# Patient Record
Sex: Male | Born: 1945 | Race: White | Hispanic: No | Marital: Married | Smoking: Former smoker
Health system: Southern US, Community
[De-identification: ages and names within clinical notes are randomized; demographics above are authoritative.]

## PROBLEM LIST (undated history)

## (undated) DIAGNOSIS — F419 Anxiety disorder, unspecified: Secondary | ICD-10-CM

## (undated) DIAGNOSIS — E669 Obesity, unspecified: Secondary | ICD-10-CM

## (undated) DIAGNOSIS — G4730 Sleep apnea, unspecified: Secondary | ICD-10-CM

## (undated) DIAGNOSIS — I10 Essential (primary) hypertension: Secondary | ICD-10-CM

## (undated) DIAGNOSIS — Z9889 Other specified postprocedural states: Secondary | ICD-10-CM

## (undated) DIAGNOSIS — E119 Type 2 diabetes mellitus without complications: Secondary | ICD-10-CM

## (undated) DIAGNOSIS — A809 Acute poliomyelitis, unspecified: Secondary | ICD-10-CM

## (undated) DIAGNOSIS — M1990 Unspecified osteoarthritis, unspecified site: Secondary | ICD-10-CM

## (undated) DIAGNOSIS — T4145XA Adverse effect of unspecified anesthetic, initial encounter: Secondary | ICD-10-CM

## (undated) DIAGNOSIS — I499 Cardiac arrhythmia, unspecified: Secondary | ICD-10-CM

## (undated) DIAGNOSIS — R112 Nausea with vomiting, unspecified: Secondary | ICD-10-CM

## (undated) DIAGNOSIS — T8859XA Other complications of anesthesia, initial encounter: Secondary | ICD-10-CM

## (undated) DIAGNOSIS — N200 Calculus of kidney: Secondary | ICD-10-CM

## (undated) HISTORY — PX: VASECTOMY: SHX75

## (undated) HISTORY — DX: Essential (primary) hypertension: I10

## (undated) HISTORY — DX: Anxiety disorder, unspecified: F41.9

## (undated) HISTORY — DX: Unspecified osteoarthritis, unspecified site: M19.90

## (undated) HISTORY — DX: Type 2 diabetes mellitus without complications: E11.9

---

## 2002-03-30 HISTORY — PX: LUNG SURGERY: SHX703

## 2003-01-01 ENCOUNTER — Emergency Department (HOSPITAL_COMMUNITY): Admission: EM | Admit: 2003-01-01 | Discharge: 2003-01-01 | Payer: Self-pay | Admitting: *Deleted

## 2003-01-01 ENCOUNTER — Encounter: Payer: Self-pay | Admitting: *Deleted

## 2003-01-09 ENCOUNTER — Inpatient Hospital Stay (HOSPITAL_COMMUNITY): Admission: AD | Admit: 2003-01-09 | Discharge: 2003-01-22 | Payer: Self-pay | Admitting: Family Medicine

## 2003-01-09 ENCOUNTER — Encounter: Payer: Self-pay | Admitting: Family Medicine

## 2003-01-09 ENCOUNTER — Ambulatory Visit (HOSPITAL_COMMUNITY): Admission: RE | Admit: 2003-01-09 | Discharge: 2003-01-09 | Payer: Self-pay | Admitting: Family Medicine

## 2003-01-12 ENCOUNTER — Encounter: Payer: Self-pay | Admitting: Family Medicine

## 2003-01-13 ENCOUNTER — Encounter: Payer: Self-pay | Admitting: Thoracic Surgery

## 2003-01-14 ENCOUNTER — Encounter: Payer: Self-pay | Admitting: Thoracic Surgery

## 2003-01-15 ENCOUNTER — Encounter: Payer: Self-pay | Admitting: Thoracic Surgery

## 2003-01-16 ENCOUNTER — Encounter: Payer: Self-pay | Admitting: Thoracic Surgery

## 2003-01-17 ENCOUNTER — Encounter: Payer: Self-pay | Admitting: Thoracic Surgery

## 2003-01-18 ENCOUNTER — Encounter: Payer: Self-pay | Admitting: Thoracic Surgery

## 2003-01-19 ENCOUNTER — Encounter: Payer: Self-pay | Admitting: Thoracic Surgery

## 2003-01-20 ENCOUNTER — Encounter: Payer: Self-pay | Admitting: Thoracic Surgery

## 2003-01-21 ENCOUNTER — Encounter: Payer: Self-pay | Admitting: Thoracic Surgery

## 2003-01-22 ENCOUNTER — Encounter: Payer: Self-pay | Admitting: Thoracic Surgery

## 2003-01-30 ENCOUNTER — Encounter: Admission: RE | Admit: 2003-01-30 | Discharge: 2003-01-30 | Payer: Self-pay | Admitting: Thoracic Surgery

## 2003-02-20 ENCOUNTER — Encounter: Admission: RE | Admit: 2003-02-20 | Discharge: 2003-02-20 | Payer: Self-pay | Admitting: Thoracic Surgery

## 2003-03-13 ENCOUNTER — Encounter: Admission: RE | Admit: 2003-03-13 | Discharge: 2003-03-13 | Payer: Self-pay | Admitting: Thoracic Surgery

## 2003-06-12 ENCOUNTER — Encounter: Admission: RE | Admit: 2003-06-12 | Discharge: 2003-06-12 | Payer: Self-pay | Admitting: Thoracic Surgery

## 2006-05-18 ENCOUNTER — Ambulatory Visit (HOSPITAL_COMMUNITY): Admission: RE | Admit: 2006-05-18 | Discharge: 2006-05-18 | Payer: Self-pay | Admitting: Internal Medicine

## 2006-05-27 ENCOUNTER — Ambulatory Visit: Payer: Self-pay | Admitting: Orthopedic Surgery

## 2008-06-18 ENCOUNTER — Ambulatory Visit (HOSPITAL_COMMUNITY): Admission: RE | Admit: 2008-06-18 | Discharge: 2008-06-18 | Payer: Self-pay | Admitting: Family Medicine

## 2010-04-19 ENCOUNTER — Encounter: Payer: Self-pay | Admitting: Thoracic Surgery

## 2010-07-10 LAB — CREATININE, SERUM
Creatinine, Ser: 1.02 mg/dL (ref 0.4–1.5)
GFR calc Af Amer: >60 mL/min (ref >60)
GFR calc non Af Amer: >60 mL/min (ref >60)

## 2012-06-14 ENCOUNTER — Ambulatory Visit (INDEPENDENT_AMBULATORY_CARE_PROVIDER_SITE_OTHER): Payer: Medicare Other

## 2012-06-14 ENCOUNTER — Encounter: Payer: Self-pay | Admitting: Orthopedic Surgery

## 2012-06-14 ENCOUNTER — Ambulatory Visit (INDEPENDENT_AMBULATORY_CARE_PROVIDER_SITE_OTHER): Payer: Medicare Other | Admitting: Orthopedic Surgery

## 2012-06-14 ENCOUNTER — Ambulatory Visit: Payer: Medicare Other

## 2012-06-14 DIAGNOSIS — M25579 Pain in unspecified ankle and joints of unspecified foot: Secondary | ICD-10-CM

## 2012-06-14 DIAGNOSIS — M25572 Pain in left ankle and joints of left foot: Secondary | ICD-10-CM

## 2012-06-14 DIAGNOSIS — M19079 Primary osteoarthritis, unspecified ankle and foot: Secondary | ICD-10-CM

## 2012-06-14 MED ORDER — DICLOFENAC SODIUM 1 % TD GEL: 2.0000 g | Freq: Four times a day (QID) | TRANSDERMAL | Status: DC

## 2012-06-14 NOTE — Progress Notes (Signed)
Patient ID: [name], male   DOB: 06-Sep-1945, 67 y.o.   MRN: [id number] Chief Complaint  Patient presents with  . Foot Pain    rolled left ankle     Patient history 67 year-old male rolled his left foot approximately 06/02/2012. He has a history of post polio residual cavus foot. He's had pain in his ankle and foot for years his pain increased after his injury. He complains of sharp 8/10 intermittent pain which is worse with walking and associated with swelling most the pain is around the ankle and dorsum of the foot  Review of systems she reports some fatigue he has some sleep apnea and uses a CPAP machine, complains of anxiety easy bruising and seasonal allergies as well as muscle pain. Other than his COPD he's had some lung surgery he has diabetes  He takes naproxen alprazolam metformin doxycycline and lisinopril He has a history of lung disease cancer and diabetes  He is married he operates a Scientist, research (physical sciences) he does not smoke or drink and he completed his education through the 12th grade  General appearance is normal, the patient is alert and oriented x3 with normal mood and affect. 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

## 2012-06-14 NOTE — Patient Instructions (Signed)
Start voltaren gel apply as directed

## 2012-06-21 ENCOUNTER — Telehealth: Payer: Self-pay | Admitting: Orthopedic Surgery

## 2012-06-21 NOTE — Telephone Encounter (Signed)
Patient called, wants to verify dosage of Voltaren gel, which he received from his mail-in pharmacy as prescribed, at "apply 2 grams, 4X daily topically" - although he states the manufacturers' instructions indicate to apply 4 grams 4x daily (twice the amount.)  He will start with Dr. Mort Sawyers instructions as prescribe -- please call him at [phone].

## 2012-06-21 NOTE — Telephone Encounter (Signed)
Returned call to patient and instructed him to use per Dr. Mort Sawyers instructions, which is 2 gm 4 times a day.

## 2012-12-20 ENCOUNTER — Other Ambulatory Visit: Payer: Self-pay | Admitting: *Deleted

## 2012-12-20 ENCOUNTER — Telehealth: Payer: Self-pay | Admitting: Orthopedic Surgery

## 2012-12-20 DIAGNOSIS — M1990 Unspecified osteoarthritis, unspecified site: Secondary | ICD-10-CM

## 2012-12-20 MED ORDER — NABUMETONE 500 MG PO TABS: 500.0000 mg | ORAL_TABLET | Freq: Two times a day (BID) | ORAL | Status: DC

## 2012-12-20 NOTE — Telephone Encounter (Signed)
Sent in prescription electronically. Patient is aware.

## 2012-12-20 NOTE — Telephone Encounter (Signed)
[name] says the Voltaren Gel is not doing much for his pain.  He asked if you will prescribe  Another  Medicine for his arthritis, one that has the least side effects. He gets his medicine by mail, a 90 day supply from : [facility]                                                                                       [address]                                                                                       [place], [place]  [address]                                                                              Phone # [phone]

## 2012-12-20 NOTE — Telephone Encounter (Signed)
relafen 500 bid

## 2013-03-27 ENCOUNTER — Other Ambulatory Visit: Payer: Self-pay | Admitting: *Deleted

## 2013-03-27 DIAGNOSIS — M1990 Unspecified osteoarthritis, unspecified site: Secondary | ICD-10-CM

## 2013-03-27 MED ORDER — NABUMETONE 500 MG PO TABS: 500.0000 mg | ORAL_TABLET | Freq: Two times a day (BID) | ORAL | Status: DC

## 2013-04-24 ENCOUNTER — Telehealth: Payer: Self-pay | Admitting: Orthopedic Surgery

## 2013-04-24 NOTE — Telephone Encounter (Signed)
Patient called to ask question about a possible fax we may have received or will receive regarding his refill of Relafin (generic) 500mg ; states his insurance has no subsitute for it, in the "Tier I" plan, if I am understanding correctly.  Please call patient at cell ph# [phone].

## 2013-04-25 NOTE — Telephone Encounter (Signed)
Spoke with patient and advised when they fax the paperwork we will fill it out and send it in.

## 2013-04-25 NOTE — Telephone Encounter (Signed)
Left message for patient to return my call.

## 2013-11-27 ENCOUNTER — Encounter (INDEPENDENT_AMBULATORY_CARE_PROVIDER_SITE_OTHER): Payer: Self-pay | Admitting: *Deleted

## 2013-11-28 ENCOUNTER — Encounter (INDEPENDENT_AMBULATORY_CARE_PROVIDER_SITE_OTHER): Payer: Self-pay

## 2013-12-05 ENCOUNTER — Other Ambulatory Visit (INDEPENDENT_AMBULATORY_CARE_PROVIDER_SITE_OTHER): Payer: Self-pay | Admitting: *Deleted

## 2013-12-05 DIAGNOSIS — Z86010 Personal history of colonic polyps: Secondary | ICD-10-CM

## 2013-12-29 ENCOUNTER — Telehealth (INDEPENDENT_AMBULATORY_CARE_PROVIDER_SITE_OTHER): Payer: Self-pay | Admitting: *Deleted

## 2013-12-29 DIAGNOSIS — Z1211 Encounter for screening for malignant neoplasm of colon: Secondary | ICD-10-CM

## 2013-12-29 NOTE — Telephone Encounter (Signed)
Patient needs movi prep 

## 2014-01-01 MED ORDER — PEG-KCL-NACL-NASULF-NA ASC-C 100 G PO SOLR: 1.0000 | Freq: Once | ORAL | Status: DC

## 2014-01-04 ENCOUNTER — Telehealth (INDEPENDENT_AMBULATORY_CARE_PROVIDER_SITE_OTHER): Payer: Self-pay | Admitting: *Deleted

## 2014-01-04 NOTE — Telephone Encounter (Signed)
Referring MD/PCP: patient states no PCP   Procedure: tcs  Reason/Indication:  Hx polyps  Has patient had this procedure before?  Yes, 2008 -- scanned  If so, when, by whom and where?    Is there a family history of colon cancer?  no  Who?  What age when diagnosed?    Is patient diabetic?   yes      Does patient have prosthetic heart valve?  no  Do you have a pacemaker?  no  Has patient ever had endocarditis? no  Has patient had joint replacement within last 12 months?  no  Does patient tend to be constipated or take laxatives? no  Is patient on Coumadin, Plavix and/or Aspirin? no  Medications: metformin 500 mg bid, lisinopril 20 mg daily, doxycycline 100 mg daily, alprazolam 1 mg bid  Allergies: sulfur drugs  Medication Adjustment: hold metformin evening before and morning of  Procedure date & time: 02/01/14 at 1200

## 2014-01-04 NOTE — Telephone Encounter (Signed)
agree

## 2014-01-18 ENCOUNTER — Encounter (HOSPITAL_COMMUNITY): Payer: Medicare Other | Admitting: Pharmacy Technician

## 2014-02-01 ENCOUNTER — Encounter (HOSPITAL_COMMUNITY)
Admission: RE | Disposition: A | Discharge status: Home / Self Care | Payer: Medicare Other | Source: Ambulatory Visit | Attending: Internal Medicine

## 2014-02-01 ENCOUNTER — Other Ambulatory Visit: Payer: Self-pay

## 2014-02-01 ENCOUNTER — Observation Stay (HOSPITAL_COMMUNITY)
Admission: RE | Admit: 2014-02-01 | Discharge: 2014-02-03 | Disposition: A | Discharge status: Home / Self Care | Payer: Medicare Other | Source: Ambulatory Visit | Attending: Internal Medicine | Admitting: Internal Medicine

## 2014-02-01 ENCOUNTER — Encounter (HOSPITAL_COMMUNITY): Payer: Self-pay | Admitting: *Deleted

## 2014-02-01 DIAGNOSIS — E119 Type 2 diabetes mellitus without complications: Secondary | ICD-10-CM | POA: Diagnosis not present

## 2014-02-01 DIAGNOSIS — K635 Polyp of colon: Principal | ICD-10-CM | POA: Insufficient documentation

## 2014-02-01 DIAGNOSIS — G4730 Sleep apnea, unspecified: Secondary | ICD-10-CM | POA: Diagnosis not present

## 2014-02-01 DIAGNOSIS — K644 Residual hemorrhoidal skin tags: Secondary | ICD-10-CM | POA: Diagnosis not present

## 2014-02-01 DIAGNOSIS — I5022 Chronic systolic (congestive) heart failure: Secondary | ICD-10-CM | POA: Diagnosis not present

## 2014-02-01 DIAGNOSIS — Z86010 Personal history of colonic polyps: Secondary | ICD-10-CM | POA: Insufficient documentation

## 2014-02-01 DIAGNOSIS — Z6839 Body mass index (BMI) 39.0-39.9, adult: Secondary | ICD-10-CM | POA: Insufficient documentation

## 2014-02-01 DIAGNOSIS — E669 Obesity, unspecified: Secondary | ICD-10-CM | POA: Diagnosis not present

## 2014-02-01 DIAGNOSIS — K219 Gastro-esophageal reflux disease without esophagitis: Secondary | ICD-10-CM | POA: Diagnosis not present

## 2014-02-01 DIAGNOSIS — K649 Unspecified hemorrhoids: Secondary | ICD-10-CM

## 2014-02-01 DIAGNOSIS — D123 Benign neoplasm of transverse colon: Secondary | ICD-10-CM

## 2014-02-01 DIAGNOSIS — I4891 Unspecified atrial fibrillation: Secondary | ICD-10-CM | POA: Diagnosis present

## 2014-02-01 DIAGNOSIS — K6389 Other specified diseases of intestine: Secondary | ICD-10-CM

## 2014-02-01 DIAGNOSIS — Z79899 Other long term (current) drug therapy: Secondary | ICD-10-CM | POA: Insufficient documentation

## 2014-02-01 DIAGNOSIS — I10 Essential (primary) hypertension: Secondary | ICD-10-CM | POA: Diagnosis not present

## 2014-02-01 DIAGNOSIS — F419 Anxiety disorder, unspecified: Secondary | ICD-10-CM | POA: Diagnosis present

## 2014-02-01 HISTORY — DX: Acute poliomyelitis, unspecified: A80.9

## 2014-02-01 HISTORY — DX: Obesity, unspecified: E66.9

## 2014-02-01 HISTORY — DX: Sleep apnea, unspecified: G47.30

## 2014-02-01 HISTORY — PX: COLONOSCOPY: SHX5424

## 2014-02-01 LAB — COMPREHENSIVE METABOLIC PANEL
ALT: 26 U/L (ref 0–53)
AST: 28 U/L (ref 0–37)
Albumin: 3.9 g/dL (ref 3.5–5.2)
Alkaline Phosphatase: 32 U/L — ABNORMAL LOW (ref 39–117)
Anion gap: 14 (ref 5–15)
BUN: 17 mg/dL (ref 6–23)
CALCIUM: 9.2 mg/dL (ref 8.4–10.5)
CO2: 23 mEq/L (ref 19–32)
Chloride: 103 mEq/L (ref 96–112)
Creatinine, Ser: 1.16 mg/dL (ref 0.50–1.35)
GFR, EST AFRICAN AMERICAN: 72 mL/min — AB (ref >90)
GFR, EST NON AFRICAN AMERICAN: 62 mL/min — AB (ref >90)
GLUCOSE: 119 mg/dL — AB (ref 70–99)
Potassium: 5.2 mEq/L (ref 3.7–5.3)
Sodium: 140 mEq/L (ref 137–147)
TOTAL PROTEIN: 7.3 g/dL (ref 6.0–8.3)
Total Bilirubin: 1.1 mg/dL (ref 0.3–1.2)

## 2014-02-01 LAB — GLUCOSE, CAPILLARY
GLUCOSE-CAPILLARY: 134 mg/dL — AB (ref 70–99)
Glucose-Capillary: 111 mg/dL — ABNORMAL HIGH (ref 70–99)
Glucose-Capillary: 129 mg/dL — ABNORMAL HIGH (ref 70–99)

## 2014-02-01 LAB — CBC
HCT: 47.6 % (ref 39.0–52.0)
Hemoglobin: 15.9 g/dL (ref 13.0–17.0)
MCH: 30.3 pg (ref 26.0–34.0)
MCHC: 33.4 g/dL (ref 30.0–36.0)
MCV: 90.7 fL (ref 78.0–100.0)
PLATELETS: 219 10*3/uL (ref 150–400)
RBC: 5.25 MIL/uL (ref 4.22–5.81)
RDW: 13.1 % (ref 11.5–15.5)
WBC: 9.1 10*3/uL (ref 4.0–10.5)

## 2014-02-01 LAB — TROPONIN I: Troponin I: <0.3 ng/mL (ref <0.30)

## 2014-02-01 SURGERY — COLONOSCOPY
Anesthesia: Moderate Sedation

## 2014-02-01 MED ORDER — SODIUM CHLORIDE 0.9 % IV SOLN
250.0000 mL | INTRAVENOUS | Status: DC | PRN
Start: 2014-02-01 — End: 2014-02-02

## 2014-02-01 MED ORDER — MIDAZOLAM HCL 5 MG/5ML IJ SOLN
INTRAMUSCULAR | Status: AC
  Filled 2014-02-01: qty 10

## 2014-02-01 MED ORDER — MIDAZOLAM HCL 5 MG/5ML IJ SOLN
INTRAMUSCULAR | Status: DC | PRN
  Administered 2014-02-01: 3 mg via INTRAVENOUS
  Administered 2014-02-01: 2 mg via INTRAVENOUS
  Administered 2014-02-01: 3 mg via INTRAVENOUS
  Administered 2014-02-01: 2 mg via INTRAVENOUS

## 2014-02-01 MED ORDER — STERILE WATER FOR IRRIGATION IR SOLN
Status: DC | PRN
  Administered 2014-02-01: 12:00:00

## 2014-02-01 MED ORDER — METOPROLOL TARTRATE 25 MG PO TABS: 25.0000 mg | ORAL_TABLET | Freq: Two times a day (BID) | ORAL | Status: DC

## 2014-02-01 MED ORDER — HYDROCODONE-ACETAMINOPHEN 5-325 MG PO TABS: 1.0000 | ORAL_TABLET | ORAL | Status: DC | PRN

## 2014-02-01 MED ORDER — METOPROLOL TARTRATE 25 MG PO TABS
25.0000 mg | ORAL_TABLET | Freq: Four times a day (QID) | ORAL | Status: DC
  Administered 2014-02-01 – 2014-02-03 (×8): 25 mg via ORAL
  Filled 2014-02-01 (×8): qty 1

## 2014-02-01 MED ORDER — SODIUM CHLORIDE 0.9 % IJ SOLN
3.0000 mL | Freq: Two times a day (BID) | INTRAMUSCULAR | Status: DC
  Administered 2014-02-01 – 2014-02-03 (×2): 3 mL via INTRAVENOUS

## 2014-02-01 MED ORDER — ALPRAZOLAM 1 MG PO TABS
1.0000 mg | ORAL_TABLET | Freq: Once | ORAL | Status: AC | PRN
  Administered 2014-02-01: 1 mg via ORAL
  Filled 2014-02-01: qty 1

## 2014-02-01 MED ORDER — ASPIRIN 325 MG PO TABS
325.0000 mg | ORAL_TABLET | Freq: Every day | ORAL | Status: DC
  Administered 2014-02-01 – 2014-02-03 (×3): 325 mg via ORAL
  Filled 2014-02-01 (×3): qty 1

## 2014-02-01 MED ORDER — SODIUM CHLORIDE 0.9 % IJ SOLN: 3.0000 mL | INTRAMUSCULAR | Status: DC | PRN

## 2014-02-01 MED ORDER — LISINOPRIL 10 MG PO TABS
20.0000 mg | ORAL_TABLET | Freq: Every day | ORAL | Status: DC
  Administered 2014-02-01 – 2014-02-03 (×3): 20 mg via ORAL
  Filled 2014-02-01 (×3): qty 2

## 2014-02-01 MED ORDER — METFORMIN HCL 500 MG PO TABS
500.0000 mg | ORAL_TABLET | Freq: Two times a day (BID) | ORAL | Status: DC
  Administered 2014-02-01 – 2014-02-03 (×4): 500 mg via ORAL
  Filled 2014-02-01 (×4): qty 1

## 2014-02-01 MED ORDER — ALUM & MAG HYDROXIDE-SIMETH 200-200-20 MG/5ML PO SUSP: 30.0000 mL | Freq: Four times a day (QID) | ORAL | Status: DC | PRN

## 2014-02-01 MED ORDER — SODIUM CHLORIDE 0.9 % IV SOLN
INTRAVENOUS | Status: DC
  Administered 2014-02-01: 1000 mL via INTRAVENOUS

## 2014-02-01 MED ORDER — DIPHENHYDRAMINE HCL 25 MG PO CAPS: 25.0000 mg | ORAL_CAPSULE | Freq: Every day | ORAL | Status: DC | PRN

## 2014-02-01 MED ORDER — ONDANSETRON HCL 4 MG/2ML IJ SOLN
4.0000 mg | Freq: Four times a day (QID) | INTRAMUSCULAR | Status: DC | PRN
Start: 2014-02-01 — End: 2014-02-03

## 2014-02-01 MED ORDER — MEPERIDINE HCL 50 MG/ML IJ SOLN
INTRAMUSCULAR | Status: DC | PRN
  Administered 2014-02-01 (×2): 25 mg

## 2014-02-01 MED ORDER — MEPERIDINE HCL 50 MG/ML IJ SOLN
INTRAMUSCULAR | Status: AC
  Filled 2014-02-01: qty 1

## 2014-02-01 MED ORDER — DILTIAZEM HCL 25 MG/5ML IV SOLN
10.0000 mg | Freq: Once | INTRAVENOUS | Status: AC
  Administered 2014-02-01: 10 mg via INTRAVENOUS
  Filled 2014-02-01: qty 5

## 2014-02-01 MED ORDER — SODIUM CHLORIDE 0.9 % IJ SOLN
3.0000 mL | Freq: Two times a day (BID) | INTRAMUSCULAR | Status: DC
  Administered 2014-02-01: 3 mL via INTRAVENOUS

## 2014-02-01 MED ORDER — INSULIN ASPART 100 UNIT/ML ~~LOC~~ SOLN
0.0000 [IU] | Freq: Three times a day (TID) | SUBCUTANEOUS | Status: DC
  Administered 2014-02-02 – 2014-02-03 (×3): 2 [IU] via SUBCUTANEOUS

## 2014-02-01 MED ORDER — ONDANSETRON HCL 4 MG PO TABS: 4.0000 mg | ORAL_TABLET | Freq: Four times a day (QID) | ORAL | Status: DC | PRN

## 2014-02-01 MED ORDER — ALPRAZOLAM 1 MG PO TABS
1.0000 mg | ORAL_TABLET | Freq: Three times a day (TID) | ORAL | Status: DC | PRN
  Administered 2014-02-01 – 2014-02-03 (×4): 1 mg via ORAL
  Filled 2014-02-01 (×4): qty 1

## 2014-02-01 MED ORDER — ACETAMINOPHEN 650 MG RE SUPP: 650.0000 mg | Freq: Four times a day (QID) | RECTAL | Status: DC | PRN

## 2014-02-01 MED ORDER — ACETAMINOPHEN 325 MG PO TABS
650.0000 mg | ORAL_TABLET | Freq: Four times a day (QID) | ORAL | Status: DC | PRN
Start: 2014-02-01 — End: 2014-02-03

## 2014-02-01 MED ORDER — ENOXAPARIN SODIUM 40 MG/0.4ML ~~LOC~~ SOLN
40.0000 mg | SUBCUTANEOUS | Status: DC
  Administered 2014-02-01 – 2014-02-02 (×2): 40 mg via SUBCUTANEOUS
  Filled 2014-02-01 (×2): qty 0.4

## 2014-02-01 MED ORDER — METOPROLOL TARTRATE 25 MG PO TABS
50.0000 mg | ORAL_TABLET | Freq: Once | ORAL | Status: AC
  Administered 2014-02-01: 50 mg via ORAL
  Filled 2014-02-01: qty 2

## 2014-02-01 MED ORDER — MORPHINE SULFATE 2 MG/ML IJ SOLN: 1.0000 mg | INTRAMUSCULAR | Status: DC | PRN

## 2014-02-01 MED ORDER — INSULIN ASPART 100 UNIT/ML ~~LOC~~ SOLN: 0.0000 [IU] | Freq: Every day | SUBCUTANEOUS | Status: DC

## 2014-02-01 NOTE — Op Note (Addendum)
COLONOSCOPY PROCEDURE REPORT  PATIENT:  [name]  MR#:  [id number] Birthdate:  1945/11/05, 68 y.o., male Endoscopist:  Dr. Rogene Houston, MD Referred By:  Dr. Glo Herring, MD Procedure Date: 02/01/2014  Procedure:   Colonoscopy  Indications: Patient is 68 year old Caucasian male with history of colonic adenomas who is here for surveillance colonoscopy.  Informed Consent:  The procedure and risks were reviewed with the patient and informed consent was obtained.  Medications:  Demerol 50 mg IV Versed 10 mg IV  Description of procedure:  After a digital rectal exam was performed, that colonoscope was advanced from the anus through the rectum and colon to the area of the cecum, ileocecal valve and appendiceal orifice. The cecum was deeply intubated. These structures were well-seen and photographed for the record. From the level of the cecum and ileocecal valve, the scope was slowly and cautiously withdrawn. The mucosal surfaces were carefully surveyed utilizing scope tip to flexion to facilitate fold flattening as needed. The scope was pulled down into the rectum where a thorough exam including retroflexion was performed.  Findings:   Prep excellent except he had take stool coating the cecal mucosa. This area was washed and landmarks well seen. Small polyp ablated via cold biopsy from splenic flexure. Normal rectal mucosa. Small hemorrhoids below the dentate line along with three tiny anal papillae.     Therapeutic/Diagnostic Maneuvers Performed:  See above  Complications:  none  Cecal Withdrawal Time:  14 minutes  Impression:  Examination performed to cecum. Small polyp ablated via cold biopsy from splenic flexure. External hemorrhoids and 3 anal papillae.  Recommendations:  Patient is to be admitted to a telemetry bed for further management of new onset of atrial fibrillation. I will contact patient with biopsy results and further recommendations.  REHMAN,NAJEEB  U  02/01/2014 12:42 PM  CC: Dr. Glo Herring., MD & Dr. Rayne Du ref. provider found

## 2014-02-01 NOTE — Progress Notes (Addendum)
Dr. Laural Golden in to recheck pt. Hr down to 135 BP 144/97. Decision to go ahead with colonscopy. Oral meds have been given as ordered. Pt denies any chest pain or shortness of breath. "Just nervous".

## 2014-02-01 NOTE — H&P (Addendum)
[name] is an 68 y.o. male.   Chief Complaint:  Patient is here for colonoscopy. HPI:  Patient is 68 year old Caucasian male with history of colonic adenomas and is here for surveillance colonoscopy. Last exam was in September 2008 with removal of 1 tubular adenoma and he had couple of adenomas removed on prior colonoscopy.he denies abdominal pain change in bowel habits or rectal bleeding. Family history is negative for CRC. Patient was noted to be in rapid A. Fib that he does not have chest pain or shortness of breath. He did not take alprazolam and other medications this morning. There is no history of atrial fibrillation.patient was given 1 mg of alprazolam and 50 mg of metoprolol by mouth in preop area with decrease in heart rate and also is blood pressure.  Past Medical History  Diagnosis Date  . COPD (chronic obstructive pulmonary disease)   . Diabetes mellitus   . Hypertension   . Anxiety   . Arthritis   . Polio   . Sleep apnea   . GERD (gastroesophageal reflux disease)     Past Surgical History  Procedure Laterality Date  . Lung surgery N/A   . Vasectomy      History reviewed. No pertinent family history. Social History:  reports that he quit smoking about 2 months ago. He does not have any smokeless tobacco history on file. He reports that he drinks alcohol. He reports that he does not use illicit drugs.  Allergies:  Allergies  Allergen Reactions  . Sulfa Antibiotics Rash    Medications Prior to Admission  Medication Sig Dispense Refill  . ALPRAZolam (XANAX) 1 MG tablet Take 1 mg by mouth 3 (three) times daily as needed for anxiety.    . diphenhydrAMINE (BENADRYL) 25 mg capsule Take 25 mg by mouth daily as needed for allergies.    Marland Kitchen doxycycline (VIBRAMYCIN) 100 MG capsule Take 100 mg by mouth daily.    Marland Kitchen lisinopril (PRINIVIL,ZESTRIL) 20 MG tablet Take 20 mg by mouth daily.    . metFORMIN (GLUCOPHAGE) 500 MG tablet Take 500 mg by mouth 2 (two) times daily with  a meal.      Results for orders placed or performed during the hospital encounter of 02/01/14 (from the past 48 hour(s))  Glucose, capillary     Status: Abnormal   Collection Time: 02/01/14 11:05 AM  Result Value Ref Range   Glucose-Capillary 134 (H) 70 - 99 mg/dL   No results found.  ROS  Blood pressure 144/97, pulse 135, temperature 97.4 F (36.3 C), temperature source Oral, resp. rate 20, height 5\' 10"  (1.778 m), weight 277 lb (125.646 kg), SpO2 98 %. Physical Exam  Constitutional: He appears well-developed and well-nourished.  HENT:  Mouth/Throat: Oropharynx is clear and moist.  Eyes: Conjunctivae are normal. No scleral icterus.  Neck: No thyromegaly present.  Cardiovascular: Normal heart sounds.   No murmur heard. Irregular rhythm normal S1 and S2. No murmur or gallop noted.  Respiratory: Effort normal and breath sounds normal.  GI: Soft. He exhibits no distension and no mass. There is no tenderness.  Protuberant abdomen with umbilical hernia  Musculoskeletal: He exhibits no edema.  Lymphadenopathy:    He has no cervical adenopathy.  Neurological: He is alert.  Skin: Skin is warm and dry.     Assessment/Plan History of colonic adenomas. New onset of atrial fibrillation. Surveillance colonoscopy.    Maranatha [place] U 02/01/2014, 11:58 AM

## 2014-02-01 NOTE — Progress Notes (Signed)
Pt transferred to room 319 on dept 300 via wheelchair from post-op after colonoscopy. Report given to Janace Aris, RN.

## 2014-02-01 NOTE — Progress Notes (Signed)
Patient wears CPAP at home, explained to me he would rather wear his unit, wife is going to run home and get his personal cpap.

## 2014-02-01 NOTE — Progress Notes (Addendum)
Dr. Laural Golden in to see pt concerning his tachycardia and hypertension. Stat EKG done. Dr. Laural Golden ordering medication for pt and calling his primary physician- Dr. Gerarda Fraction. Medication given as ordered. Pt denies chest pain or respiratory difficulty.

## 2014-02-01 NOTE — H&P (Signed)
[facility] History and Physical  [name] FYB:[id number] DOB: 05/21/1945 DOA: 02/01/2014  Referring physician: Laural Golden PCP: Glo Herring., MD   Chief Complaint: afib  HPI: [name] is a very pleasant  68 y.o. male with a past medical history that includes diabetes, hypertension, sleep apnea, obesity, anxiety presents to room 319 as found to be in A. Fib and hypertension prior to colonoscopy this afternoon. Prior to the procedure he was given 1 mg of alprazolam and 50 mg of metoprolol by mouth with a decrease in heart rate and blood pressure. Procedure completed patient's heart rate remained in the low 100s with a stabilize blood pressure. Was offered discharged to home but felt more comfortable being admitted for observation overnight. He reports that he has no cardiac history and has never seen a cardiologist. He denies chest pain palpitations shortness of breath. He denies orthopnea or lower extremity edema. He denies any recent illness or sick contacts. He states that in preparation for the colonoscopy he did not take any of his medications the day before the procedure which  Include 3 mg of Xanax a day, lisinopril and metformin. He arrived to room 319 from postop care he is afebrile hemodynamically stable in atrial fib at a rate of 99. He is not hypoxic.  Review of Systems:  10 point review of systems completed and all systems are negative except as indicated in the history of present illness  Past Medical History  Diagnosis Date  . Diabetes mellitus   . Hypertension   . Anxiety   . Arthritis   . Polio   . Sleep apnea   . GERD (gastroesophageal reflux disease)   . Obesity    Past Surgical History  Procedure Laterality Date  . Lung surgery N/A   . Vasectomy     Social History:  reports that he quit smoking about 2 months ago. He does not have any smokeless tobacco history on file. He reports that he drinks alcohol. He reports that he does not use illicit  drugs. Is married lives at home with his wife he does use a cane for ambulation due to history of polio affecting the left ankle. He reports falling at least 2 times a month Allergies  Allergen Reactions  . Sulfa Antibiotics Rash    History reviewed. No pertinent family history. MI medical history is reviewed and non-contributory to the admission of this elderly gentleman  Prior to Admission medications   Medication Sig Start Date End Date Taking? Authorizing Provider  ALPRAZolam Duanne Moron) 1 MG tablet Take 1 mg by mouth 3 (three) times daily as needed for anxiety.   Yes Historical Provider, MD  diphenhydrAMINE (BENADRYL) 25 mg capsule Take 25 mg by mouth daily as needed for allergies.   Yes Historical Provider, MD  doxycycline (VIBRAMYCIN) 100 MG capsule Take 100 mg by mouth daily.   Yes Historical Provider, MD  lisinopril (PRINIVIL,ZESTRIL) 20 MG tablet Take 20 mg by mouth daily.   Yes Historical Provider, MD  metFORMIN (GLUCOPHAGE) 500 MG tablet Take 500 mg by mouth 2 (two) times daily with a meal.   Yes Historical Provider, MD  metoprolol tartrate (LOPRESSOR) 25 MG tablet Take 1 tablet (25 mg total) by mouth 2 (two) times daily. 02/01/14   Rogene Houston, MD   Physical Exam: Filed Vitals:   02/01/14 1350 02/01/14 1355 02/01/14 1400 02/01/14 1419  BP: 124/74 116/92 119/96 127/73  Pulse:    99  Temp:    98.4 F (  36.9 C)  TempSrc:    Oral  Resp: 17 18 18 18   Height:    5\' 10"  (1.778 m)  Weight:    125.646 kg (277 lb)  SpO2: 95% 94% 94% 95%    Wt Readings from Last 3 Encounters:  02/01/14 125.646 kg (277 lb)  06/14/12 124.286 kg (274 lb)    General:  Appears calm and comfortable, obese Eyes: PERRL, normal lids, irises & conjunctiva, no scleral icterus ENT: grossly normal hearing, Lucas membranes of his mouth are pink and moist Neck: no LAD, masses or thyromegaly Cardiovascular: irregularly irregular I hear no murmur no gallop no rub there is no lower extremity  edema Respiratory: CTA bilaterally, no w/r/r. Normal respiratory effort. Sounds somewhat distant Abdomen: obese soft positive bowel sounds throughout nontender to palpation no guarding or rebounding Skin: no rash or induration seen on limited exam Musculoskeletal: grossly normal tone BUE/BLE Psychiatric: grossly normal mood and affect, speech fluent and appropriate Neurologic: grossly non-focal. Speech clear facial symmetry          Labs on Admission:  Basic Metabolic Panel: No results for input(s): NA, K, CL, CO2, GLUCOSE, BUN, CREATININE, CALCIUM, MG, PHOS in the last 168 hours. Liver Function Tests: No results for input(s): AST, ALT, ALKPHOS, BILITOT, PROT, ALBUMIN in the last 168 hours. No results for input(s): LIPASE, AMYLASE in the last 168 hours. No results for input(s): AMMONIA in the last 168 hours. CBC: No results for input(s): WBC, NEUTROABS, HGB, HCT, MCV, PLT in the last 168 hours. Cardiac Enzymes: No results for input(s): CKTOTAL, CKMB, CKMBINDEX, TROPONINI in the last 168 hours.  BNP (last 3 results) No results for input(s): PROBNP in the last 8760 hours. CBG:  Recent Labs Lab 02/01/14 1105  GLUCAP 134*    Radiological Exams on Admission: No results found.  EKG: await EKG results  Assessment/Plan Principal Problem:   A-fib: etiology uncertain but I favor a multifactorial scenario specifically anxiety in the setting of mild benzo withdrawal and missing doses of antihypertensive medications in preparation for colonoscopy. Will admit for observation to telemetry. Will obtain an EKG, cycle troponin, obtain a 2-D echo to evaluate LV function. He was given 1 mg of Xanax and 50 mg of metoprolol prior to colonoscopy with good results. Home medications include 3 mg of Xanax daily on an as-needed basis. I will continue this. On arrival to the floor heart rate 103. I will provide metoprolol 25 mg twice a day with parameters. Provide aspirin on admission. Chads score 2 (age  and diabetes) which qualifies for anticoagulation. However I suspect he'll convert on his own if he doesn't we will discuss which anticoagulant agent he prefers. He will likely need outpatient cardiology follow-up as well should he not convert back to normal sinus rhythm. Active Problems:  Hypertension: related to anxiety and missed antihypertensive doses. He received metoprolol prior to his colonoscopy today. On presentation to the floor blood pressure is 127/73. I will resume his home lisinopril and provide metoprolol as indicated above with parameters    Diabetes mellitus: he is on metformin at home he typically does not check his blood sugar. I will obtain a hemoglobin A1c continue his metformin and provide him with a carb modified diet. I will also request nutritional consult for diabetes/carb education. Will also use sliding scale insulin as needed for optimal control     Anxiety: he appears to be stable at baseline. He professes to be "very nervous man". He reports taken about 3 mg of  Xanax daily. I will order this on a when necessary basis. I suspect part of his issues related to the fact that he missed a day and a half worth of Xanax. Will monitor    Sleep apnea; Likely related to obstruction given his abdominal girth. Will request respiratory consult first EPAP.    GERD (gastroesophageal reflux disease): Appears stable at baseline.    Obesity: BMI 39.8. Will request nutritional consult for education in nutrition with weight loss being the goal  Polio: reports diagnosed with this when he was very young at the age of 2 were braces recovered nicely had no issues until about a year and a half ago 1 left ankle became weak and somewhat deformed. He's been using a cane ever since. He reports falling 2-3 times a month. Will request PT evaluation for function    Code Status: full DVT Prophylaxis: Family Communication: full Disposition Plan: home hopefully tomorrow  Time spent: 55  minutes  [facility] Pager [phone]

## 2014-02-02 ENCOUNTER — Encounter (HOSPITAL_COMMUNITY): Payer: Self-pay | Admitting: Internal Medicine

## 2014-02-02 DIAGNOSIS — I059 Rheumatic mitral valve disease, unspecified: Secondary | ICD-10-CM

## 2014-02-02 DIAGNOSIS — K635 Polyp of colon: Secondary | ICD-10-CM | POA: Diagnosis not present

## 2014-02-02 DIAGNOSIS — K219 Gastro-esophageal reflux disease without esophagitis: Secondary | ICD-10-CM

## 2014-02-02 LAB — BASIC METABOLIC PANEL
Anion gap: 12 (ref 5–15)
BUN: 22 mg/dL (ref 6–23)
CO2: 26 mEq/L (ref 19–32)
Calcium: 9.3 mg/dL (ref 8.4–10.5)
Chloride: 103 mEq/L (ref 96–112)
Creatinine, Ser: 1.31 mg/dL (ref 0.50–1.35)
GFR, EST AFRICAN AMERICAN: 62 mL/min — AB (ref >90)
GFR, EST NON AFRICAN AMERICAN: 54 mL/min — AB (ref >90)
GLUCOSE: 133 mg/dL — AB (ref 70–99)
Potassium: 4.8 mEq/L (ref 3.7–5.3)
SODIUM: 141 meq/L (ref 137–147)

## 2014-02-02 LAB — GLUCOSE, CAPILLARY
GLUCOSE-CAPILLARY: 110 mg/dL — AB (ref 70–99)
Glucose-Capillary: 137 mg/dL — ABNORMAL HIGH (ref 70–99)
Glucose-Capillary: 120 mg/dL — ABNORMAL HIGH (ref 70–99)
Glucose-Capillary: 125 mg/dL — ABNORMAL HIGH (ref 70–99)

## 2014-02-02 LAB — URINALYSIS, ROUTINE W REFLEX MICROSCOPIC
Bilirubin Urine: NEGATIVE
GLUCOSE, UA: NEGATIVE mg/dL
HGB URINE DIPSTICK: NEGATIVE
Ketones, ur: NEGATIVE mg/dL
Leukocytes, UA: NEGATIVE
Nitrite: NEGATIVE
PH: 5.5 (ref 5.0–8.0)
Protein, ur: NEGATIVE mg/dL
Urobilinogen, UA: 0.2 mg/dL (ref 0.0–1.0)

## 2014-02-02 LAB — CBC
HCT: 46.4 % (ref 39.0–52.0)
HEMOGLOBIN: 15.3 g/dL (ref 13.0–17.0)
MCH: 29.9 pg (ref 26.0–34.0)
MCHC: 33 g/dL (ref 30.0–36.0)
MCV: 90.8 fL (ref 78.0–100.0)
Platelets: 187 10*3/uL (ref 150–400)
RBC: 5.11 MIL/uL (ref 4.22–5.81)
RDW: 13.1 % (ref 11.5–15.5)
WBC: 6.3 10*3/uL (ref 4.0–10.5)

## 2014-02-02 LAB — HEMOGLOBIN A1C
Hgb A1c MFr Bld: 7.2 % — ABNORMAL HIGH (ref <5.7)
Mean Plasma Glucose: 160 mg/dL — ABNORMAL HIGH (ref <117)

## 2014-02-02 LAB — TSH: TSH: 1.57 u[IU]/mL (ref 0.350–4.500)

## 2014-02-02 LAB — TROPONIN I: Troponin I: <0.3 ng/mL (ref <0.30)

## 2014-02-02 MED ORDER — LIVING WELL WITH DIABETES BOOK
Freq: Once | Status: AC
  Administered 2014-02-02: 1
  Filled 2014-02-02: qty 1

## 2014-02-02 MED ORDER — ASPIRIN 325 MG PO TABS: 325.0000 mg | ORAL_TABLET | Freq: Every day | ORAL | Status: DC

## 2014-02-02 MED ORDER — DILTIAZEM HCL 60 MG PO TABS
60.0000 mg | ORAL_TABLET | Freq: Three times a day (TID) | ORAL | Status: DC
  Administered 2014-02-02 – 2014-02-03 (×4): 60 mg via ORAL
  Filled 2014-02-02 (×4): qty 1

## 2014-02-02 MED ORDER — DILTIAZEM HCL ER COATED BEADS 180 MG PO CP24: 180.0000 mg | ORAL_CAPSULE | Freq: Every day | ORAL | Status: DC

## 2014-02-02 MED ORDER — METOPROLOL TARTRATE 50 MG PO TABS: 50.0000 mg | ORAL_TABLET | Freq: Two times a day (BID) | ORAL | Status: DC

## 2014-02-02 NOTE — Evaluation (Addendum)
Physical Therapy Evaluation Patient Details Name: [name] MRN: [id number] DOB: [birth year] Today's Date: 02/02/2014   History of Present Illness  Patient is 68 year old Caucasian male with history of colonic adenomas and is here for surveillance colonoscopy. Last exam was in September 2008 with removal of 1 tubular adenoma and he had couple of adenomas removed on prior colonoscopy.he denies abdominal pain change in bowel habits or rectal bleeding.  Clinical Impression  Pt with a h/o post-polio and presents with club feet. Pt presents with elevated HR with gait. Pt is Independent with bed mobility and transfers. Pt's HR ranged from 110-163 with gait. Pt will have assistance at home if needed. Pt is currently requiring supervision with gait due to HR. Nursing staff can monitor his HR on the unit with gait. Pt does not have an acute skilled PT and is d/c from PT services.    Follow Up Recommendations No PT follow up    Equipment Recommendations  None recommended by PT    Recommendations for Other Services       Precautions / Restrictions Restrictions Weight Bearing Restrictions: No      Mobility  Bed Mobility Overal bed mobility: Independent                Transfers Overall transfer level: Independent                  Ambulation/Gait Ambulation/Gait assistance: Supervision Ambulation Distance (Feet): 75 Feet Assistive device: Straight cane Gait Pattern/deviations: Step-through pattern   Gait velocity interpretation: at or above normal speed for age/gender General Gait Details: HR elevates with gait 165, supervision needed to monitor HR  Stairs            Wheelchair Mobility    Modified Rankin (Stroke Patients Only)       Balance Overall balance assessment: No apparent balance deficits (not formally assessed)                                           Pertinent Vitals/Pain Pain Assessment: No/denies pain    Home Living  Family/patient expects to be discharged to:: Private residence Living Arrangements: Spouse/significant other Available Help at Discharge: Family Type of Home: House Home Access: Stairs to enter   Technical brewer of Steps: 2-3 Home Layout: Two level;Able to live on main level with bedroom/bathroom Home Equipment: Cane - single point Additional Comments: Pt has post-polio syndrome and club feet. He has a h/o falls due to his left anklle rolling.    Prior Function Level of Independence: Independent with assistive device(s)               Hand Dominance        Extremity/Trunk Assessment   Upper Extremity Assessment: Defer to OT evaluation           Lower Extremity Assessment: Overall WFL for tasks assessed (bilateral club feet)      Cervical / Trunk Assessment: Normal  Communication   Communication: No difficulties  Cognition                            General Comments      Exercises        Assessment/Plan    PT Assessment Patent does not need any further PT services  PT Diagnosis     PT Problem List  PT Treatment Interventions     PT Goals (Current goals can be found in the Care Plan section)      Frequency     Barriers to discharge        Co-evaluation               End of Session Equipment Utilized During Treatment: Gait belt Activity Tolerance: Treatment limited secondary to medical complications (Comment) (HR) Patient left: in bed;with call bell/phone within reach Nurse Communication: Mobility status         Time: 1130-1148 PT Time Calculation (min): 18 min   Charges:   PT Evaluation $Initial PT Evaluation Tier I: 1 Procedure     PT G Codes:          Lelon Mast 02/02/2014, 11:52 AM  Late addendum by Rehab supervisor to add g codes per therapist clinical judgement:  [id number], [id number], [place].  Added by rehab supervisor as therapist no longer working at this location and unable to addend the  note.

## 2014-02-02 NOTE — Progress Notes (Signed)
Patient refused diabetic videos at this time.  Requested to watch them later this evening after visitors leave.  Will inform oncoming nurse.

## 2014-02-02 NOTE — Progress Notes (Signed)
UR completed 

## 2014-02-02 NOTE — Plan of Care (Signed)
Problem: Phase I Progression Outcomes Goal: Heart rate or rhythm control medication Outcome: Progressing

## 2014-02-02 NOTE — Progress Notes (Signed)
Patient's heart rate increases to 140's while ambulating but returns to baseline at rest.  Notified MD.  Will continue to monitor patient.

## 2014-02-02 NOTE — Progress Notes (Signed)
Attempted to set patient up for education videos.  Was unable to get connected to videos.  Oncoming nurse is going to attempt on her shift.

## 2014-02-02 NOTE — Progress Notes (Signed)
While ambulating in hall with physical therapy, patient's heart rate increased to 172.  Notified MD and received verbal order to give scheduled Cardizem early.  Will continue to monitor patient.

## 2014-02-02 NOTE — Progress Notes (Signed)
  Echocardiogram 2D Echocardiogram has been performed.  [place], [place] 02/02/2014, 9:58 AM

## 2014-02-02 NOTE — Plan of Care (Signed)
Problem: Phase I Progression Outcomes Goal: Ventricular heart rate < 120/min Outcome: Progressing

## 2014-02-02 NOTE — Plan of Care (Signed)
Problem: Phase I Progression Outcomes Goal: Heart rate or rhythm control medication Outcome: Progressing  Comments:  Notified MD that Heart rate increases when ambulating.  MD ordered cardizem for patient.

## 2014-02-02 NOTE — Progress Notes (Signed)
  RD consulted for nutrition education regarding diabetes/weight loss.    Lab Results  Component Value Date   HGBA1C 7.2* 02/01/2014    RD provided "Carbohydrate Counting for People with Diabetes" and Weight Loss Tips" handouts from the Academy of [employer]etics. Discussed different food groups and their effects on blood sugar, emphasizing carbohydrate-containing foods. Provided list of carbohydrates and recommended serving sizes of common foods.   Discussed importance of controlled and consistent carbohydrate intake throughout the day. Provided examples of ways to balance meals/snacks and encouraged intake of high-fiber, whole grain complex carbohydrates. Teach back method used.  Expect good compliance.  Body mass index is 38.97 kg/(m^2). Pt meets criteria for obesity class II based on current BMI.  Current diet order is CHO Modified/Heart Healthy, patient is consuming approximately 75% of meals at this time. Labs and medications reviewed. RD contact information provided.   Pt has been referred to outpatient RD for additional nutrition counseling.   Colman Cater MS,RD,CSG,LDN Office: [phone] Pager: [phone]

## 2014-02-02 NOTE — Progress Notes (Signed)
Consult received for dm on Metformin with HgbA1C of 7.2%. Pt has primary care provider and [employer].  If pt should be checking glucose at home, I have ordered pt education per video and nursing education. Have ordered RD consult for nutrition education and pt ed booklet. Please instruct pt to follow up with PCP as to checking cbg's at home.  Thank you, Rosita Kea, RN, CNS, Diabetes Coordinator [phone])

## 2014-02-02 NOTE — Discharge Summary (Signed)
Physician Discharge Summary  [name] UVO:[id number] DOB: 02-19-1946 DOA: 02/01/2014  PCP: [name]., MD  Admit date: 02/01/2014 Discharge date: 02/02/2014  Time spent: 40 minutes  Recommendations for Outpatient Follow-up:  1. PCP 1 week for evaluation of HR and rhythm. Consider OP cardiology referral. Discuss anti-coagulation in setting of frequent falls. Evaluate BP and diltiazem added to home meds.  2. Dr Olevia Perches office will contact with results biopsy   Discharge Diagnoses:  Principal Problem:   A-fib Active Problems:   Diabetes mellitus   Hypertension   Anxiety   Sleep apnea   GERD (gastroesophageal reflux disease)   Obesity   Atrial fibrillation with RVR   Discharge Condition: stable  Diet recommendation: heart healthy carb modified  Filed Weights   02/01/14 1047 02/01/14 1419 02/02/14 0555  Weight: 125.646 kg (277 lb) 125.646 kg (277 lb) 123.2 kg (271 lb 9.7 oz)    History of present illness:  [name] is a very pleasant 68 y.o. man with a past medical history that includes diabetes, hypertension, sleep apnea, obesity, anxiety presented to room 319 on 02/01/14 as found to be in A. Fib and hypertensive prior to colonoscopy that afternoon. Prior to the procedure he was given 1 mg of alprazolam and 50 mg of metoprolol by mouth with a decrease in heart rate and blood pressure. Procedure completed patient's heart rate remained in the low 100s with a stabilize blood pressure. Was offered discharged to home but felt more comfortable being admitted for observation overnight. He reported that he had no cardiac history and had never seen a cardiologist. He denied chest pain palpitations shortness of breath. He denied orthopnea or lower extremity edema. He denied any recent illness or sick contacts. He stated that in preparation for the colonoscopy he did not take any of his medications the day before the procedure which Included 3 mg of Xanax and, lisinopril  and metformin. He arrived to room 319 from postop care he was afebrile hemodynamically stable in atrial fib at a rate of 99. He was not hypoxic.   Hospital Course:  A-fib: etiology uncertain but I favor a multifactorial scenario specifically anxiety in the setting of mild benzo withdrawal and missing doses of antihypertensive medications in preparation for colonoscopy. Provided with BB and resumed home lisinopril and rate improved at rest. With ambulation rate increased so diltiazem added.  EKG with afib. Troponin negative x3, 2-D echo yields wall thickness wasincreased in a pattern of moderate LVH. Systolic function was mildly to moderately reduced. The estimated ejection fraction was in the range of 40% to 45%. The study was not technicallysufficient to allow evaluation of LV diastolic dysfunction due toatrial fibrillation.   Provided aspirin on admission. Chads score 2 (age and diabetes) which qualifies for anticoagulation. He also has frequent falls due to unsteady gait. Discussed anti-coagulation and he will follow up with PCP and OP cardiology referral to be considered.  Active Problems: Hypertension: controlled at discharge.   Diabetes mellitus: uncontrolled. A1c 7.2.    Anxiety: stable during hospitalization   Sleep apnea stable   GERD (gastroesophageal reflux disease): remained stable   Obesity: BMI 39.8.   Polio: reports diagnosed with this when he was very young at the age of 2 were braces recovered nicely had no issues until about a year and a half ago 1 left ankle became weak and somewhat deformed. He's been using a cane ever since. He reports falling 2-3 times a month. Will request PT evaluation for function  Procedures: Colonoscopy 02/01/14 examination performed to cecum. Small polyp ablated via cold biopsy from splenic flexure.  External hemorrhoids and 3 anal papillae.  Consultations:  none  Discharge Exam: Filed Vitals:   02/02/14 1145  BP:   Pulse:  163  Temp:   Resp:     General: obese appears comfortable Cardiovascular: irregularly irregular Respiratory: normal effort BS clear bilaterally no wheeze  Discharge Instructions You were cared for by a hospitalist during your hospital stay. If you have any questions about your discharge medications or the care you received while you were in the hospital after you are discharged, you can call the unit and asked to speak with the hospitalist on call if the hospitalist that took care of you is not available. Once you are discharged, your primary care physician will handle any further medical issues. Please note that NO REFILLS for any discharge medications will be authorized once you are discharged, as it is imperative that you return to your primary care physician (or establish a relationship with a primary care physician if you do not have one) for your aftercare needs so that they can reassess your need for medications and monitor your lab values.   Current Discharge Medication List    START taking these medications   Details  aspirin 325 MG tablet Take 1 tablet (325 mg total) by mouth daily.    diltiazem (CARDIZEM CD) 180 MG 24 hr capsule Take 1 capsule (180 mg total) by mouth daily. Qty: 30 capsule, Refills: 0    metoprolol (LOPRESSOR) 50 MG tablet Take 1 tablet (50 mg total) by mouth 2 (two) times daily. Qty: 60 tablet, Refills: 0      CONTINUE these medications which have NOT CHANGED   Details  ALPRAZolam (XANAX) 1 MG tablet Take 1 mg by mouth 3 (three) times daily as needed for anxiety.    diphenhydrAMINE (BENADRYL) 25 mg capsule Take 25 mg by mouth daily as needed for allergies.    doxycycline (VIBRAMYCIN) 100 MG capsule Take 100 mg by mouth daily.    lisinopril (PRINIVIL,ZESTRIL) 20 MG tablet Take 20 mg by mouth daily.    metFORMIN (GLUCOPHAGE) 500 MG tablet Take 500 mg by mouth 2 (two) times daily with a meal.       Allergies  Allergen Reactions  . Sulfa Antibiotics  Rash   Follow-up Information    Follow up with [name]., MD. Schedule an appointment as soon as possible for a visit in 1 week.   Specialty:  Internal Medicine   Why:  for evaluation of HR and rythm. discuss OP cardiology referral. determine anti-coag needs   Contact information:   [address] [place] [address] [phone]        The results of significant diagnostics from this hospitalization (including imaging, microbiology, ancillary and laboratory) are listed below for reference.    Significant Diagnostic Studies: No results found.  Microbiology: No results found for this or any previous visit (from the past 240 hour(s)).   Labs: Basic Metabolic Panel:  Recent Labs Lab 02/01/14 1506 02/02/14 0249  NA 140 141  K 5.2 4.8  CL 103 103  CO2 23 26  GLUCOSE 119* 133*  BUN 17 22  CREATININE 1.16 1.31  CALCIUM 9.2 9.3   Liver Function Tests:  Recent Labs Lab 02/01/14 1506  AST 28  ALT 26  ALKPHOS 32*  BILITOT 1.1  PROT 7.3  ALBUMIN 3.9   No results for input(s): LIPASE, AMYLASE in the last  168 hours. No results for input(s): AMMONIA in the last 168 hours. CBC:  Recent Labs Lab 02/01/14 1506 02/02/14 0249  WBC 9.1 6.3  HGB 15.9 15.3  HCT 47.6 46.4  MCV 90.7 90.8  PLT 219 187   Cardiac Enzymes:  Recent Labs Lab 02/01/14 1506 02/01/14 2035 02/02/14 0249  TROPONINI <0.30 <0.30 <0.30   BNP: BNP (last 3 results) No results for input(s): PROBNP in the last 8760 hours. CBG:  Recent Labs Lab 02/01/14 1105 02/01/14 1628 02/01/14 2135 02/02/14 0800 02/02/14 1122  GLUCAP 134* 111* 129* 137* 110*       Signed:  BLACK,KAREN M  [facility] 02/02/2014, 12:04 PM

## 2014-02-03 DIAGNOSIS — K635 Polyp of colon: Secondary | ICD-10-CM | POA: Diagnosis not present

## 2014-02-03 LAB — GLUCOSE, CAPILLARY
GLUCOSE-CAPILLARY: 130 mg/dL — AB (ref 70–99)
Glucose-Capillary: 147 mg/dL — ABNORMAL HIGH (ref 70–99)

## 2014-02-03 MED ORDER — DILTIAZEM HCL ER COATED BEADS 180 MG PO CP24: 180.0000 mg | ORAL_CAPSULE | Freq: Every day | ORAL | Status: DC

## 2014-02-03 MED ORDER — METOPROLOL TARTRATE 50 MG PO TABS: 50.0000 mg | ORAL_TABLET | Freq: Two times a day (BID) | ORAL | Status: DC

## 2014-02-03 MED ORDER — ASPIRIN 325 MG PO TABS: 325.0000 mg | ORAL_TABLET | Freq: Every day | ORAL | Status: DC

## 2014-02-03 NOTE — Discharge Summary (Signed)
Physician Discharge Summary  [name] BPZ:[id number] DOB: [birth year] DOA: 02/01/2014  PCP: [name]., MD  Admit date: 02/01/2014 Discharge date: 02/03/2014  Time spent: 40 minutes  Recommendations for Outpatient Follow-up:  1. Patient will follow up with cardiology on 02/08/14 for further follow-up on atrial fibrillation and need for anticoagulation 2. Follow-up with primary care physician a 1-2 weeks 3. Follow-up with GI as needed for biopsy results obtained during colonoscopy  Discharge Diagnoses:  Principal Problem:   Atrial fibrillation with RVR Active Problems:   Diabetes mellitus   Hypertension   Anxiety   Sleep apnea   GERD (gastroesophageal reflux disease)   A-fib   Obesity chronic systolic congestive heart failure, EF 40-45%  Discharge Condition: improved  Diet recommendation: low-salt, low carb  Filed Weights   02/01/14 1047 02/01/14 1419 02/02/14 0555  Weight: 125.646 kg (277 lb) 125.646 kg (277 lb) 123.2 kg (271 lb 9.7 oz)    History of present illness:  This is a very pleasant 68 year old gentleman who had presented to a hospital for routine screening colonoscopy.prior to procedure, he was noted to be markedly hypertensive and tachycardic. He was found to be a new onset atrial fibrillation with rapid ventricular response. The patient was admitted to the hospital for further treatments.  Hospital Course:  Patient was monitored on telemetry. He was started on oral Lopressor as well as Cardizem. His heart rate has significantly improved and is now controlled. TSH is found to be normal and cardiac enzymes are currently negative. 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 He is now rate controlled. His chads score would be 3, making him a candidate for anticoagulation. I have discussed the options of starting anticoagulation  in the hospital, the patient wishes to follow up with cardiology as an outpatient to discuss this further prior to beginning any therapy. He has agreed to take aspirin at this time. We have arrange cardiology follow-up on 11/12 to discuss anticoagulation and further management of atrial fibrillation. Patient is feeling significantly improved. He was ambulated in the hall and maintained control of his heart rate. He is not short of breath and does not have any chest pain. Patient is ready for discharge home today.  [name],[name]   Procedures:  Echo: - Left ventricle: The cavity size was normal. Wall thickness was increased in a pattern of moderate LVH. Systolic function was mildly to moderately reduced. The estimated ejection fraction was in the range of 40% to 45%. The study was not technically sufficient to allow evaluation of LV diastolic dysfunction due to atrial fibrillation. - Aortic valve: Mildly to moderately calcified annulus. Trileaflet; mildly thickened leaflets. - Aorta: The visualized portion of the proximal ascending thoracic aorta is mildly dialted at 3.7 cm. Aortic root dimension: 32 mm (ED). - Aortic root: The aortic root was normal in size. - Mitral valve: Mildly calcified annulus. Mildly thickened leaflets . There was mild regurgitation. - Left atrium: The atrium was severely dilated. - Right ventricle: The cavity size was mildly to moderately dilated. - Right atrium: The atrium was severely dilated. - Atrial septum: No defect or patent foramen ovale was identified. - Inferior vena cava: The vessel was dilated. The respirophasic diameter changes were blunted (< 50%), consistent with elevated central venous pressure.  Consultations:    Discharge Exam: Filed Vitals:   02/03/14 1056  BP:   Pulse: 63  Temp:  Resp:     General: NAD Cardiovascular: S1, s2 irregular Respiratory: CTA B  Discharge Instructions You were cared for by a  hospitalist during your hospital stay. If you have any questions about your discharge medications or the care you received while you were in the hospital after you are discharged, you can call the unit and asked to speak with the hospitalist on call if the hospitalist that took care of you is not available. Once you are discharged, your primary care physician will handle any further medical issues. Please note that NO REFILLS for any discharge medications will be authorized once you are discharged, as it is imperative that you return to your primary care physician (or establish a relationship with a primary care physician if you do not have one) for your aftercare needs so that they can reassess your need for medications and monitor your lab values.  Discharge Instructions    Call MD for:  difficulty breathing, headache or visual disturbances    Complete by:  As directed      Call MD for:  extreme fatigue    Complete by:  As directed      Call MD for:  persistant dizziness or light-headedness    Complete by:  As directed      Diet - low sodium heart healthy    Complete by:  As directed      Diet Carb Modified    Complete by:  As directed      Increase activity slowly    Complete by:  As directed           Current Discharge Medication List    START taking these medications   Details  aspirin 325 MG tablet Take 1 tablet (325 mg total) by mouth daily. Qty: 30 tablet, Refills: 0    diltiazem (CARDIZEM CD) 180 MG 24 hr capsule Take 1 capsule (180 mg total) by mouth daily. Qty: 30 capsule, Refills: 0    metoprolol (LOPRESSOR) 50 MG tablet Take 1 tablet (50 mg total) by mouth 2 (two) times daily. Qty: 60 tablet, Refills: 0      CONTINUE these medications which have NOT CHANGED   Details  ALPRAZolam (XANAX) 1 MG tablet Take 1 mg by mouth 3 (three) times daily as needed for anxiety.    diphenhydrAMINE (BENADRYL) 25 mg capsule Take 25 mg by mouth daily as needed for allergies.     doxycycline (VIBRAMYCIN) 100 MG capsule Take 100 mg by mouth daily.    lisinopril (PRINIVIL,ZESTRIL) 20 MG tablet Take 20 mg by mouth daily.    metFORMIN (GLUCOPHAGE) 500 MG tablet Take 500 mg by mouth 2 (two) times daily with a meal.       Allergies  Allergen Reactions  . Sulfa Antibiotics Rash   Follow-up Information    Follow up with [name]., MD. Schedule an appointment as soon as possible for a visit in 1 week.   Specialty:  Internal Medicine   Contact information:   [address] [place] [address] [phone]       Follow up with Herminio Commons, MD On 02/08/2014.   Specialty:  Cardiology   Why:  8:40am   Contact information:   [place] [address] [phone]        The results of significant diagnostics from this hospitalization (including imaging, microbiology, ancillary and laboratory) are listed below for reference.    Significant Diagnostic Studies: No results found.  Microbiology: No results  found for this or any previous visit (from the past 240 hour(s)).   Labs: Basic Metabolic Panel:  Recent Labs Lab 02/01/14 1506 02/02/14 0249  NA 140 141  K 5.2 4.8  CL 103 103  CO2 23 26  GLUCOSE 119* 133*  BUN 17 22  CREATININE 1.16 1.31  CALCIUM 9.2 9.3   Liver Function Tests:  Recent Labs Lab 02/01/14 1506  AST 28  ALT 26  ALKPHOS 32*  BILITOT 1.1  PROT 7.3  ALBUMIN 3.9   No results for input(s): LIPASE, AMYLASE in the last 168 hours. No results for input(s): AMMONIA in the last 168 hours. CBC:  Recent Labs Lab 02/01/14 1506 02/02/14 0249  WBC 9.1 6.3  HGB 15.9 15.3  HCT 47.6 46.4  MCV 90.7 90.8  PLT 219 187   Cardiac Enzymes:  Recent Labs Lab 02/01/14 1506 02/01/14 2035 02/02/14 0249  TROPONINI <0.30 <0.30 <0.30   BNP: BNP (last 3 results) No results for input(s): PROBNP in the last 8760 hours. CBG:  Recent Labs Lab 02/02/14 1122 02/02/14 1721 02/02/14 2134 02/03/14 0719  02/03/14 1126  GLUCAP 110* 120* 125* 130* 147*       Signed:  MEMON,[name]  [facility] 02/03/2014, 2:32 PM

## 2014-02-03 NOTE — Progress Notes (Signed)
Videos unattainable this shift.

## 2014-02-03 NOTE — Progress Notes (Signed)
Utilization Review completed.  

## 2014-02-03 NOTE — Discharge Instructions (Signed)

## 2014-02-03 NOTE — Progress Notes (Signed)
Pt alert, oriented, and vital signs stable upon discharge. Reviewed discharge instructions and paperwork. Pt given prescriptions. No iv present upon discharge.

## 2014-02-08 ENCOUNTER — Encounter: Payer: Self-pay | Admitting: Cardiovascular Disease

## 2014-02-08 ENCOUNTER — Ambulatory Visit (INDEPENDENT_AMBULATORY_CARE_PROVIDER_SITE_OTHER): Payer: Medicare Other | Admitting: Cardiovascular Disease

## 2014-02-08 ENCOUNTER — Other Ambulatory Visit: Payer: Self-pay | Admitting: Cardiovascular Disease

## 2014-02-08 VITALS — BP 124/88 | HR 90 | Ht 70.0 in | Wt 271.0 lb

## 2014-02-08 DIAGNOSIS — I4891 Unspecified atrial fibrillation: Secondary | ICD-10-CM

## 2014-02-08 DIAGNOSIS — I429 Cardiomyopathy, unspecified: Secondary | ICD-10-CM

## 2014-02-08 DIAGNOSIS — I10 Essential (primary) hypertension: Secondary | ICD-10-CM

## 2014-02-08 DIAGNOSIS — E1121 Type 2 diabetes mellitus with diabetic nephropathy: Secondary | ICD-10-CM

## 2014-02-08 DIAGNOSIS — G4730 Sleep apnea, unspecified: Secondary | ICD-10-CM

## 2014-02-08 DIAGNOSIS — E1122 Type 2 diabetes mellitus with diabetic chronic kidney disease: Secondary | ICD-10-CM

## 2014-02-08 DIAGNOSIS — N183 Chronic kidney disease, stage 3 (moderate): Secondary | ICD-10-CM

## 2014-02-08 DIAGNOSIS — M1990 Unspecified osteoarthritis, unspecified site: Secondary | ICD-10-CM

## 2014-02-08 MED ORDER — METOPROLOL SUCCINATE ER 50 MG PO TB24: 50.0000 mg | ORAL_TABLET | Freq: Two times a day (BID) | ORAL | Status: DC

## 2014-02-08 MED ORDER — EDOXABAN TOSYLATE 60 MG PO TABS: 60.0000 mg | ORAL_TABLET | Freq: Every day | ORAL | Status: DC

## 2014-02-08 NOTE — Progress Notes (Signed)
Patient ID: [name], male   DOB: September 28, 1945, 68 y.o.   MRN: [id number]       CARDIOLOGY CONSULT NOTE  Patient ID: [name] MRN: [id number] DOB/AGE: [birth year] 68 y.o.  Admit date: (Not on file) Primary Physician Glo Herring., MD  Reason for Consultation: cardiomyopathy, atrial fibrillation  HPI:  The patient is a 68 year old male with a history of hypertension, sleep apnea, and diabetes mellitus who was recently hospitalized for new onset, rapid atrial fibrillation. This developed prior to undergoing a screening colonoscopy. It was incidentally found as the patient had no symptoms whatsoever. TSH and troponins were normal. An echocardiogram was performed which demonstrated mild to moderately reduced left ventricular systolic function, EF 63-01%, mild mitral regurgitation, severe left atrial enlargement, mild to moderate right ventricular enlargement, and severe right atrial enlargement. He was started on metoprolol and diltiazem as well as aspirin 325 mg. While he has felt somewhat sluggish since starting these new medications, he denies any chest pain, palpitations, dizziness, shortness of breath, orthopnea, leg swelling, and paroxysmal nocturnal dyspnea. 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 Ever since then, he has had left ankle swelling and weakness. He was diagnosed with sleep apnea proximally 5 years ago and uses CPAP daily. He is here with this wife.  Soc: Nonsmoker. Owns a [employer].   Allergies  Allergen Reactions  . Sulfa Antibiotics Rash    Current Outpatient Prescriptions  Medication Sig Dispense Refill  . ALPRAZolam (XANAX) 1 MG tablet Take 1 mg by mouth 3 (three) times daily as needed for anxiety.    Marland Kitchen aspirin 325 MG tablet Take 1 tablet (325 mg total) by mouth daily. 30 tablet 0  . diltiazem (CARDIZEM CD) 180 MG 24 hr capsule Take 1 capsule (180 mg total) by mouth daily. 30  capsule 0  . diphenhydrAMINE (BENADRYL) 25 mg capsule Take 25 mg by mouth daily as needed for allergies.    Marland Kitchen doxycycline (VIBRAMYCIN) 100 MG capsule Take 100 mg by mouth daily.    Marland Kitchen lisinopril (PRINIVIL,ZESTRIL) 20 MG tablet Take 20 mg by mouth daily.    . metFORMIN (GLUCOPHAGE) 500 MG tablet Take 500 mg by mouth 2 (two) times daily with a meal.    . metoprolol (LOPRESSOR) 50 MG tablet Take 1 tablet (50 mg total) by mouth 2 (two) times daily. 60 tablet 0   No current facility-administered medications for this visit.    Past Medical History  Diagnosis Date  . Diabetes mellitus   . Hypertension   . Anxiety   . Arthritis   . Polio   . Sleep apnea   . GERD (gastroesophageal reflux disease)   . Obesity     Past Surgical History  Procedure Laterality Date  . Lung surgery N/A   . Vasectomy    . Colonoscopy N/A 02/01/2014    Procedure: COLONOSCOPY;  Surgeon: Rogene Houston, MD;  Location: [facility];  Service: Endoscopy;  Laterality: N/A;  1200    History   Social History  . Marital Status: Married    Spouse Name: N/A    Number of Children: N/A  . Years of Education: N/A   Occupational History  . Not on file.   Social History Main Topics  . Smoking status: Former Smoker -- 0.50 packs/day for 10 years    Types: Cigarettes    Start date: 03/31/1951    Quit date: 11/16/2013  . Smokeless  tobacco: Never Used  . Alcohol Use: 0.0 oz/week    0 Not specified per week     Comment: social  . Drug Use: No  . Sexual Activity: Not on file   Other Topics Concern  . Not on file   Social History Narrative     No family history of premature CAD in 1st degree relatives.  Prior to Admission medications   Medication Sig Start Date End Date Taking? Authorizing Provider  ALPRAZolam Duanne Moron) 1 MG tablet Take 1 mg by mouth 3 (three) times daily as needed for anxiety.    Historical Provider, MD  aspirin 325 MG tablet Take 1 tablet (325 mg total) by mouth daily. 02/03/14   Kathie Dike, MD  diltiazem (CARDIZEM CD) 180 MG 24 hr capsule Take 1 capsule (180 mg total) by mouth daily. 02/03/14   Kathie Dike, MD  diphenhydrAMINE (BENADRYL) 25 mg capsule Take 25 mg by mouth daily as needed for allergies.    Historical Provider, MD  doxycycline (VIBRAMYCIN) 100 MG capsule Take 100 mg by mouth daily.    Historical Provider, MD  lisinopril (PRINIVIL,ZESTRIL) 20 MG tablet Take 20 mg by mouth daily.    Historical Provider, MD  metFORMIN (GLUCOPHAGE) 500 MG tablet Take 500 mg by mouth 2 (two) times daily with a meal.    Historical Provider, MD  metoprolol (LOPRESSOR) 50 MG tablet Take 1 tablet (50 mg total) by mouth 2 (two) times daily. 02/03/14   Kathie Dike, MD     Review of systems complete and found to be negative unless listed above in HPI     Physical exam Blood pressure 124/88, pulse 90, height 5\' 10"  (1.778 m), weight 271 lb (122.925 kg). General: NAD Neck: No JVD, no thyromegaly or thyroid nodule.  Lungs: Clear to auscultation bilaterally with normal respiratory effort. CV: Nondisplaced PMI. Irregular rhythm, normal S1/S2, no S3, no murmur.  Mild left ankle and dorsal foot edema.  No carotid bruit.  Normal pedal pulses.  Abdomen: Soft, nontender, obese, no distention.  Skin: Intact without lesions or rashes.  Neurologic: Alert and oriented x 3.  Psych: Normal affect. Extremities: No clubbing or cyanosis.  HEENT: Normal.   ECG: Most recent ECG reviewed.  Labs:   Lab Results  Component Value Date   WBC 6.3 02/02/2014   HGB 15.3 02/02/2014   HCT 46.4 02/02/2014   MCV 90.8 02/02/2014   PLT 187 02/02/2014    Recent Labs Lab 02/01/14 1506 02/02/14 0249  NA 140 141  K 5.2 4.8  CL 103 103  CO2 23 26  BUN 17 22  CREATININE 1.16 1.31  CALCIUM 9.2 9.3  PROT 7.3  --   BILITOT 1.1  --   ALKPHOS 32*  --   ALT 26  --   AST 28  --   GLUCOSE 119* 133*   Lab Results  Component Value Date   TROPONINI <0.30 02/02/2014   No results found for: CHOL No  results found for: HDL No results found for: LDLCALC No results found for: TRIG No results found for: CHOLHDL No results found for: LDLDIRECT       Studies:  Echo - Left ventricle: The cavity size was normal. Wall thickness was increased in a pattern of moderate LVH. Systolic function was mildly to moderately reduced. The estimated ejection fraction was in the range of 40% to 45%. The study was not technically sufficient to allow evaluation of LV diastolic dysfunction due to atrial fibrillation. - Aortic  valve: Mildly to moderately calcified annulus. Trileaflet; mildly thickened leaflets. - Aorta: The visualized portion of the proximal ascending thoracic aorta is mildly dialted at 3.7 cm. Aortic root dimension: 32 mm (ED). - Aortic root: The aortic root was normal in size. - Mitral valve: Mildly calcified annulus. Mildly thickened leaflets . There was mild regurgitation. - Left atrium: The atrium was severely dilated. - Right ventricle: The cavity size was mildly to moderately dilated. - Right atrium: The atrium was severely dilated. - Atrial septum: No defect or patent foramen ovale was identified. - Inferior vena cava: The vessel was dilated. The respirophasic diameter changes were blunted (< 50%), consistent with elevated central venous pressure. - Technically adequate study.   ASSESSMENT AND PLAN:  1. Cardiomyopathy: He has no history of MI. Given the diffuse hypokinesis, this is likely to be nonischemic in etiology. 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 I will switch metoprolol tartrate to metoprolol succinate 50 mg twice daily. For the time being, I will continue long-acting diltiazem 180 mg daily but would consider reducing this dose in the future. I may consider nuclear stress testing in the future. 2. Atrial fibrillation: HR is controlled. As stated above,  will switch metoprolol tartrate to metoprolol succinate given his cardiomyopathy. Will continue long-acting diltiazem daily. CHADSVASC score 4 (CHF, HTN, age, diabetes), thus anticoagulation is indicated. I will d/c ASA and start edoxaban 60 mg daily. GFR 54 ml/min. 3. Sleep apnea: Uses CPAP daily. Likely etiology of right-sided chamber enlargement. 4. Type 2 diabetes: Mildly uncontrolled with HbA1C 7.2%. Continue metformin 500 mg bid. 5. Essential HTN: Well controlled on current therapy. No changes. 6. CKD stage 3: GFR 54 ml/min. No changes to therapy. 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 7. Arthritis: I recommended he start engaging in aqua therapy for joint pain relief (pool at [facility]).  Dispo: f/u 1 month.   Signed: Kate Sable, M.D., F.A.C.C.  02/08/2014, 8:46 AM

## 2014-02-08 NOTE — Patient Instructions (Addendum)
Your physician recommends that you schedule a follow-up appointment in: 1 month    Your physician has recommended you make the following change in your medication:     STOP Aspirin   START Savaysa 60 mg daily  START Toprol XL 50 mg twice a day  STOP Metoprolol (lopressor)         Thank you for choosing [facility] !

## 2014-02-08 NOTE — Telephone Encounter (Signed)
SAVAYSA is not covered by pt insurance. Will forward to Dr. Bronson Ing Please advise on alternatives.

## 2014-02-08 NOTE — Telephone Encounter (Signed)
Please see refill bin / tgs  °

## 2014-02-09 ENCOUNTER — Telehealth: Payer: Self-pay | Admitting: *Deleted

## 2014-02-09 MED ORDER — RIVAROXABAN 15 MG PO TABS: 15.0000 mg | ORAL_TABLET | Freq: Every day | ORAL | Status: DC

## 2014-02-09 MED ORDER — RIVAROXABAN 20 MG PO TABS: 20.0000 mg | ORAL_TABLET | Freq: Every day | ORAL | Status: DC

## 2014-02-09 NOTE — Telephone Encounter (Signed)
Pt will pick up samples today

## 2014-02-09 NOTE — Addendum Note (Signed)
Addended by: Barbarann Ehlers A on: 02/09/2014 11:29 AM   Modules accepted: Orders, Medications

## 2014-02-09 NOTE — Telephone Encounter (Signed)
Pt was seen this week and increased his blood thinners. He has blood in his urine for the past three times of urinating. He states it is bright red, not yellow or orange.

## 2014-02-09 NOTE — Telephone Encounter (Signed)
Per Dr.Koneswaran,decrease Xarelto to 15 mg daily. E-sribed to [employer] [employer], spoke with [facility], they cannot accept xarelto 20 mg back once it leaves the pharmacy.I have given pt samples

## 2014-02-09 NOTE — Telephone Encounter (Signed)
E-scribed Xarelto 20 mg daily,have samples and 30 day trial card here.Await pt call back

## 2014-02-09 NOTE — Telephone Encounter (Signed)
Started on Savaysa yesterday,took 1 tablet yesterday.States urine is bright red after two voids,denies burning or pain. We stopped the savaysa due to cost,has not started xarelto yet.

## 2014-02-09 NOTE — Telephone Encounter (Signed)
Please communicate this to the rep for Savaysa. Start Xarelto 20 mg daily.

## 2014-02-12 ENCOUNTER — Telehealth: Payer: Self-pay | Admitting: Cardiovascular Disease

## 2014-02-12 DIAGNOSIS — R319 Hematuria, unspecified: Secondary | ICD-10-CM

## 2014-02-12 LAB — CBC
HCT: 48.8 % (ref 39.0–52.0)
HEMOGLOBIN: 16.9 g/dL (ref 13.0–17.0)
MCH: 30.5 pg (ref 26.0–34.0)
MCHC: 34.6 g/dL (ref 30.0–36.0)
MCV: 87.9 fL (ref 78.0–100.0)
MPV: 11 fL (ref 9.4–12.4)
Platelets: 244 10*3/uL (ref 150–400)
RBC: 5.55 MIL/uL (ref 4.22–5.81)
RDW: 13.5 % (ref 11.5–15.5)
WBC: 8.8 10*3/uL (ref 4.0–10.5)

## 2014-02-12 NOTE — Telephone Encounter (Signed)
Please call patient back regarding blood in his urine.[name] / tgs

## 2014-02-12 NOTE — Telephone Encounter (Signed)
Spoke with Dr.Koneswaran ,he wants pt to remain on Xarelto 15 mg and see a urologist for hematuria.He plans to call [facility] where he has been seen before. He will get a CBC today

## 2014-02-13 ENCOUNTER — Telehealth: Payer: Self-pay

## 2014-02-13 ENCOUNTER — Ambulatory Visit (INDEPENDENT_AMBULATORY_CARE_PROVIDER_SITE_OTHER): Payer: Medicare Other | Admitting: Urology

## 2014-02-13 DIAGNOSIS — R310 Gross hematuria: Secondary | ICD-10-CM

## 2014-02-13 NOTE — Telephone Encounter (Signed)
error 

## 2014-02-14 ENCOUNTER — Telehealth (INDEPENDENT_AMBULATORY_CARE_PROVIDER_SITE_OTHER): Payer: Self-pay | Admitting: *Deleted

## 2014-02-14 NOTE — Telephone Encounter (Signed)
Patient called office to update his condition. It is noted in Epic what has been going on with the patient. After discharge from hospital he saw Cardiologist. Patient states that he was given 8-9 different pills to help with heart rate,blood pressure.. The next day he began to urinate blood and he is continuing to do so . Patient states that he is voiding every 15-30 minutes and its pure blood. He was told by the Cardiologist that he may have been given to much medication and this caused a"over kill." He saw the urologist,([facility]) here in [place] yesterday. They told patient that they were going to do CAT Scan. He has not heard anything from them and states that he cannot get a response from anyone. He wanted to know what Dr.Rehman thought.  Dr.Rehman was made aware. He recommended that the patient be seen in the ED as he is on blood thinner and he urinating blood and this has been on going for several days. Patient was called and made aware.

## 2014-02-14 NOTE — Telephone Encounter (Signed)
LM - Elfego is at work and would like to speak with Dr. Laural Golden. If he is not available, please have the nurse to give him a call at [phone].

## 2014-02-16 ENCOUNTER — Other Ambulatory Visit: Payer: Self-pay | Admitting: Urology

## 2014-02-16 DIAGNOSIS — R310 Gross hematuria: Secondary | ICD-10-CM

## 2014-02-19 ENCOUNTER — Ambulatory Visit (HOSPITAL_COMMUNITY)
Admission: RE | Admit: 2014-02-19 | Discharge: 2014-02-19 | Disposition: A | Discharge status: Home / Self Care | Payer: Medicare Other | Source: Ambulatory Visit | Attending: Urology | Admitting: Urology

## 2014-02-19 DIAGNOSIS — R319 Hematuria, unspecified: Secondary | ICD-10-CM | POA: Diagnosis present

## 2014-02-19 DIAGNOSIS — K429 Umbilical hernia without obstruction or gangrene: Secondary | ICD-10-CM | POA: Diagnosis not present

## 2014-02-19 DIAGNOSIS — N200 Calculus of kidney: Secondary | ICD-10-CM | POA: Insufficient documentation

## 2014-02-19 DIAGNOSIS — R310 Gross hematuria: Secondary | ICD-10-CM

## 2014-02-19 DIAGNOSIS — I2510 Atherosclerotic heart disease of native coronary artery without angina pectoris: Secondary | ICD-10-CM | POA: Diagnosis not present

## 2014-02-19 DIAGNOSIS — N289 Disorder of kidney and ureter, unspecified: Secondary | ICD-10-CM | POA: Diagnosis not present

## 2014-02-19 MED ORDER — SODIUM CHLORIDE 0.9 % IV SOLN
INTRAVENOUS | Status: AC
  Filled 2014-02-19: qty 250

## 2014-02-19 MED ORDER — IOHEXOL 300 MG/ML  SOLN
125.0000 mL | Freq: Once | INTRAMUSCULAR | Status: AC | PRN
  Administered 2014-02-19: 125 mL via INTRAVENOUS

## 2014-02-19 MED ORDER — SODIUM CHLORIDE 0.9 % IJ SOLN
INTRAMUSCULAR | Status: AC
  Filled 2014-02-19: qty 30

## 2014-02-19 MED ORDER — SODIUM CHLORIDE 0.9 % IJ SOLN
INTRAMUSCULAR | Status: AC
  Filled 2014-02-19: qty 500

## 2014-02-19 NOTE — Progress Notes (Signed)
[name] called 11.23.2015 @ 1425, pt states that he lost his Metformin instruction sheet, I told him what the instructions stated, he understoon

## 2014-03-01 ENCOUNTER — Telehealth: Payer: Self-pay | Admitting: *Deleted

## 2014-03-01 ENCOUNTER — Other Ambulatory Visit: Payer: Self-pay

## 2014-03-01 DIAGNOSIS — R319 Hematuria, unspecified: Secondary | ICD-10-CM

## 2014-03-01 NOTE — Telephone Encounter (Signed)
WILL forward to Dr. Bronson Ing

## 2014-03-01 NOTE — Telephone Encounter (Signed)
Pt will have CBC today.We explicitly told patient to stop taking Savaysa.

## 2014-03-01 NOTE — Telephone Encounter (Signed)
Pt was prescribed savaysa 60mg  on LOV 11/12.  Given samples of savaysa on 11/12 which was declined by insurance to fill RX. Pt then was switched to Xarelto 20 mg on 11/13 pt c/o of hematuria and xarelto decreased to 15 mg pt had CBC and was seen by urology. After speaking with pt, previous note from today, was in regards to him taking savaysa NOT simvastatin. Pt has in fact been taking both savaysa and xarelto and has had blood in urine for 2 weeks, CBC was normal on 11/16. Told pt to stop savaysa and continue xarelto 15 mg QD. Pt understood. Do we repeat CBC? Will forward to Dr. Bronson Ing.

## 2014-03-01 NOTE — Telephone Encounter (Signed)
Yes, please repeat CBC. I am unclear as to why he was taking both Savaysa and Xarelto.

## 2014-03-01 NOTE — Telephone Encounter (Signed)
Pt left office with samples of [place], [facility] couldn't fill rx because his insurance would not cover it.That's why we put him on Xarelto.He continued taking the Savaysa samples .

## 2014-03-01 NOTE — Telephone Encounter (Signed)
I didn't prescribe simvastatin, but this is for cholesterol reduction. Xarelto is for stroke prevention due to its anticoagulant effect. Entirely different medications for entirely different purposes.

## 2014-03-01 NOTE — Telephone Encounter (Signed)
Pt is wanting to know about xarelto and simvastatin and why he needs to be on both.

## 2014-03-02 LAB — CBC
HCT: 47 % (ref 39.0–52.0)
Hemoglobin: 16 g/dL (ref 13.0–17.0)
MCH: 30.9 pg (ref 26.0–34.0)
MCHC: 34 g/dL (ref 30.0–36.0)
MCV: 90.7 fL (ref 78.0–100.0)
MPV: 11 fL (ref 9.4–12.4)
Platelets: 212 10*3/uL (ref 150–400)
RBC: 5.18 MIL/uL (ref 4.22–5.81)
RDW: 13.3 % (ref 11.5–15.5)
WBC: 7.4 10*3/uL (ref 4.0–10.5)

## 2014-03-07 ENCOUNTER — Other Ambulatory Visit: Payer: Self-pay

## 2014-03-07 DIAGNOSIS — Z5181 Encounter for therapeutic drug level monitoring: Secondary | ICD-10-CM

## 2014-03-07 DIAGNOSIS — I4891 Unspecified atrial fibrillation: Secondary | ICD-10-CM

## 2014-03-13 LAB — BASIC METABOLIC PANEL
BUN: 22 mg/dL (ref 6–23)
CO2: 29 mEq/L (ref 19–32)
Calcium: 9.7 mg/dL (ref 8.4–10.5)
Chloride: 101 mEq/L (ref 96–112)
Creat: 1.32 mg/dL (ref 0.50–1.35)
Glucose, Bld: 202 mg/dL — ABNORMAL HIGH (ref 70–99)
POTASSIUM: 5.2 meq/L (ref 3.5–5.3)
SODIUM: 139 meq/L (ref 135–145)

## 2014-03-20 ENCOUNTER — Ambulatory Visit (INDEPENDENT_AMBULATORY_CARE_PROVIDER_SITE_OTHER): Payer: Medicare Other | Admitting: Urology

## 2014-03-20 DIAGNOSIS — N200 Calculus of kidney: Secondary | ICD-10-CM

## 2014-03-27 ENCOUNTER — Other Ambulatory Visit: Payer: Self-pay | Admitting: Urology

## 2014-03-27 DIAGNOSIS — N200 Calculus of kidney: Secondary | ICD-10-CM

## 2014-03-30 DIAGNOSIS — I499 Cardiac arrhythmia, unspecified: Secondary | ICD-10-CM

## 2014-03-30 HISTORY — DX: Cardiac arrhythmia, unspecified: I49.9

## 2014-04-03 ENCOUNTER — Encounter: Payer: Self-pay | Admitting: Cardiovascular Disease

## 2014-04-03 ENCOUNTER — Ambulatory Visit (INDEPENDENT_AMBULATORY_CARE_PROVIDER_SITE_OTHER): Payer: Medicare Other | Admitting: Cardiovascular Disease

## 2014-04-03 VITALS — BP 142/98 | HR 56 | Ht 70.0 in | Wt 277.4 lb

## 2014-04-03 DIAGNOSIS — N183 Chronic kidney disease, stage 3 (moderate): Secondary | ICD-10-CM

## 2014-04-03 DIAGNOSIS — N200 Calculus of kidney: Secondary | ICD-10-CM

## 2014-04-03 DIAGNOSIS — G4730 Sleep apnea, unspecified: Secondary | ICD-10-CM

## 2014-04-03 DIAGNOSIS — I4891 Unspecified atrial fibrillation: Secondary | ICD-10-CM

## 2014-04-03 DIAGNOSIS — I429 Cardiomyopathy, unspecified: Secondary | ICD-10-CM

## 2014-04-03 DIAGNOSIS — E1121 Type 2 diabetes mellitus with diabetic nephropathy: Secondary | ICD-10-CM

## 2014-04-03 DIAGNOSIS — E1122 Type 2 diabetes mellitus with diabetic chronic kidney disease: Secondary | ICD-10-CM

## 2014-04-03 DIAGNOSIS — I10 Essential (primary) hypertension: Secondary | ICD-10-CM

## 2014-04-03 MED ORDER — DILTIAZEM HCL ER COATED BEADS 180 MG PO CP24: 180.0000 mg | ORAL_CAPSULE | Freq: Every day | ORAL | Status: DC

## 2014-04-03 NOTE — Patient Instructions (Signed)
Your physician recommends that you schedule a follow-up appointment in: 3 months with Dr.Koneswaran   You may take your last doe of Xarelto on 04/06/14 before your kidney surgery, restart upon advise of nephrologist    All other medications remain the same      Thank you for choosing [facility] !

## 2014-04-03 NOTE — Progress Notes (Signed)
Patient ID: [name], male   DOB: 02/05/1946, 69 y.o.   MRN: [id number]      SUBJECTIVE: The patient presents for follow up of atrial fibrillation and cardiomyopathy. After starting anticoagulation he developed some hematuria, for which I referred him to urology as I informed him that this is usually indicative of a previously undiagnosed problem. He was subsequently found to have large left renal calculi, for which he is being scheduled for surgery on 04/09/14.  He currently denies palpitations, chest pain, and shortness of breath. He is taking Xarelto 15 mg daily.  His legs feel somewhat weaker as per his wife.    Review of Systems: As per "subjective", otherwise negative.  Allergies  Allergen Reactions  . Sulfa Antibiotics Rash    Current Outpatient Prescriptions  Medication Sig Dispense Refill  . ALPRAZolam (XANAX) 1 MG tablet Take 1 mg by mouth 3 (three) times daily as needed for anxiety.    Marland Kitchen diltiazem (CARDIZEM CD) 180 MG 24 hr capsule Take 1 capsule (180 mg total) by mouth daily. 30 capsule 6  . diphenhydrAMINE (BENADRYL) 25 mg capsule Take 25 mg by mouth daily as needed for allergies.    Marland Kitchen doxycycline (VIBRAMYCIN) 100 MG capsule Take 100 mg by mouth daily.    Marland Kitchen lisinopril (PRINIVIL,ZESTRIL) 20 MG tablet Take 20 mg by mouth daily.    . metFORMIN (GLUCOPHAGE) 500 MG tablet Take 500 mg by mouth 2 (two) times daily with a meal.    . metoprolol succinate (TOPROL-XL) 50 MG 24 hr tablet Take 1 tablet (50 mg total) by mouth 2 (two) times daily. Take with or immediately following a meal. 180 tablet 3  . Rivaroxaban (XARELTO) 15 MG TABS tablet Take 1 tablet (15 mg total) by mouth daily. 30 tablet 11   No current facility-administered medications for this visit.    Past Medical History  Diagnosis Date  . Diabetes mellitus   . Hypertension   . Anxiety   . Arthritis   . Polio   . Sleep apnea   . GERD (gastroesophageal reflux disease)   . Obesity     Past Surgical History    Procedure Laterality Date  . Lung surgery N/A   . Vasectomy    . Colonoscopy N/A 02/01/2014    Procedure: COLONOSCOPY;  Surgeon: Rogene Houston, MD;  Location: [facility];  Service: Endoscopy;  Laterality: N/A;  1200    History   Social History  . Marital Status: Married    Spouse Name: N/A    Number of Children: N/A  . Years of Education: N/A   Occupational History  . Not on file.   Social History Main Topics  . Smoking status: Former Smoker -- 0.50 packs/day for 10 years    Types: Cigarettes    Start date: 03/31/1951    Quit date: 11/16/2013  . Smokeless tobacco: Never Used  . Alcohol Use: 0.0 oz/week    0 Not specified per week     Comment: social  . Drug Use: No  . Sexual Activity: Not on file   Other Topics Concern  . Not on file   Social History Narrative     Filed Vitals:   04/03/14 0819  BP: 142/98  Pulse: 56  Height: 5\' 10"  (1.778 m)  Weight: 277 lb 6.4 oz (125.828 kg)  SpO2: 99%    HR by auscultation: 82 bpm  PHYSICAL EXAM General: NAD Neck: No JVD, no thyromegaly or thyroid nodule.  Lungs: Clear  to auscultation bilaterally with normal respiratory effort. CV: Nondisplaced PMI. Irregular rhythm, normal S1/S2, no S3, no murmur. Mild left ankle and dorsal foot edema. No carotid bruit. Normal pedal pulses.  Abdomen: Soft, nontender, obese, no distention.  Skin: Intact without lesions or rashes.  Neurologic: Alert and oriented x 3.  Psych: Normal affect. Extremities: No clubbing or cyanosis.  HEENT: Normal.    ECG: Most recent ECG reviewed.      ASSESSMENT AND PLAN: 1. Cardiomyopathy: He has no history of MI. Given the diffuse hypokinesis, this is likely to be nonischemic in etiology. 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 I will continue metoprolol succinate 50 mg twice daily. For the time being, I will also continue long-acting diltiazem 180 mg  daily but would consider reducing this dose in the future. I may consider nuclear stress testing in the future. 2. Atrial fibrillation: HR is controlled on long-acting metoprolol and diltiazem. He is tolerating Xarelto 15 mg daily. Once renal calculi issue is addressed, I will increase dose to 20 mg daily. Xarelto can be held 48 hours prior to surgery, and I informed the patient and his wife regarding this (last dose on 1/8, surgery on 1/11). 3. Sleep apnea: Uses CPAP daily. Likely etiology of right-sided chamber enlargement. 4. Type 2 diabetes: Mildly uncontrolled with HbA1C 7.2%. Continue metformin 500 mg bid. 5. Essential HTN: Mildly elevated today, likely due to pain from renal calculi. He will speak to urologist regarding pain management. No changes. 6. CKD stage 3: GFR 54 ml/min. No changes to therapy. 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  Dispo: f/u 3 months.  Kate Sable, M.D., F.A.C.C.

## 2014-04-18 ENCOUNTER — Telehealth: Payer: Self-pay | Admitting: Cardiovascular Disease

## 2014-04-18 NOTE — Telephone Encounter (Signed)
Needs clearance for surgery faxed to [facility] / tgs

## 2014-04-19 NOTE — Telephone Encounter (Signed)
Received surgical request form from [facility] and have placed on Dr.Koneswaran's desk for review

## 2014-04-19 NOTE — Telephone Encounter (Signed)
Spoke with medical records at [facility] and requested that they fax surgical clearance form for my doctor

## 2014-04-26 ENCOUNTER — Telehealth: Payer: Self-pay | Admitting: *Deleted

## 2014-04-26 NOTE — Telephone Encounter (Signed)
Patient was d/c'd from aspirin when Xarelto was started .will call patient to reinforce he is NOT taking ASA

## 2014-04-26 NOTE — Telephone Encounter (Signed)
Does patient need to hold Aspirin ?

## 2014-04-26 NOTE — Telephone Encounter (Signed)
They received letter stating it is okay to stop blood thinner but did not address asprin

## 2014-04-26 NOTE — Telephone Encounter (Signed)
I discontinued ASA when I initially started him on anticoagulants for atrial fibrillation.

## 2014-05-07 NOTE — Patient Instructions (Addendum)
[name]  05/07/2014   Your procedure is scheduled on: 05/10/14   Report to [facility] Main  Entrance and follow signs to               RADIOLOGY at 7:30 AM.   Call this number if you have problems the morning of surgery [phone]   Remember:  Do not eat food or drink liquids :After Midnight.     Take these medicines the morning of surgery with A SIP OF WATER:  ALPRAZOLAM / METOPROLOL               BRING C PAP TUBING AND MASK TO HOSPITAL                               You may not have any metal on your body including hair pins and              piercings  Do not wear jewelry, make-up, lotions, powders or perfumes.             Do not wear nail polish.  Do not shave  48 hours prior to surgery.              Men may shave face and neck.   Do not bring valuables to the hospital. [place].  Contacts, dentures or bridgework may not be worn into surgery.  Leave suitcase in the car. After surgery it may be brought to your room.     Patients discharged the day of surgery will not be allowed to drive home.  Name and phone number of your driver:  Special Instructions: N/A              Please read over the following fact sheets you were given: _____________________________________________________________________                                                     [place]  Before surgery, you can play an important role.  Because skin is not sterile, your skin needs to be as free of germs as possible.  You can reduce the number of germs on your skin by washing with CHG (chlorahexidine gluconate) soap before surgery.  CHG is an antiseptic cleaner which kills germs and bonds with the skin to continue killing germs even after washing. Please DO NOT use if you have an allergy to CHG or antibacterial soaps.  If your skin becomes reddened/irritated stop using the CHG and inform your nurse  when you arrive at Short Stay. Do not shave (including legs and underarms) for at least 48 hours prior to the first CHG shower.  You may shave your face. Please follow these instructions carefully:   1.  Shower with CHG Soap the night before surgery and the  morning of Surgery.   2.  If you choose to wash your hair, wash your hair first as usual with your  normal  Shampoo.   3.  After you shampoo, rinse your hair and body thoroughly to remove the  shampoo.  4.  Use CHG as you would any other liquid soap.  You can apply chg directly  to the skin and wash . Gently wash with scrungie or clean wascloth    5.  Apply the CHG Soap to your body ONLY FROM THE NECK DOWN.   Do not use on open                           Wound or open sores. Avoid contact with eyes, ears mouth and genitals (private parts).                        Genitals (private parts) with your normal soap.              6.  Wash thoroughly, paying special attention to the area where your surgery  will be performed.   7.  Thoroughly rinse your body with warm water from the neck down.   8.  DO NOT shower/wash with your normal soap after using and rinsing off  the CHG Soap .                9.  Pat yourself dry with a clean towel.             10.  Wear clean pajamas.             11.  Place clean sheets on your bed the night of your first shower and do not  sleep with pets.  Day of Surgery : Do not apply any lotions/deodorants the morning of surgery.  Please wear clean clothes to the hospital/surgery center.  FAILURE TO FOLLOW THESE INSTRUCTIONS MAY RESULT IN THE CANCELLATION OF YOUR SURGERY    PATIENT SIGNATURE_________________________________  ______________________________________________________________________    WHAT IS A BLOOD TRANSFUSION? Blood Transfusion Information  A transfusion is the replacement of blood or some of its parts. Blood is made up of multiple cells which  provide different functions.  Red blood cells carry oxygen and are used for blood loss replacement.  White blood cells fight against infection.  Platelets control bleeding.  Plasma helps clot blood.  Other blood products are available for specialized needs, such as hemophilia or other clotting disorders. BEFORE THE TRANSFUSION  Who gives blood for transfusions?   Healthy volunteers who are fully evaluated to make sure their blood is safe. This is blood bank blood. Transfusion therapy is the safest it has ever been in the practice of medicine. Before blood is taken from a donor, a complete history is taken to make sure that person has no history of diseases nor engages in risky social behavior (examples are intravenous drug use or sexual activity with multiple partners). The donor's travel history is screened to minimize risk of transmitting infections, such as malaria. The donated blood is tested for signs of infectious diseases, such as HIV and hepatitis. The blood is then tested to be sure it is compatible with you in order to minimize the chance of a transfusion reaction. If you or a relative donates blood, this is often done in anticipation of surgery and is not appropriate for emergency situations. It takes many days to process the donated blood. RISKS AND COMPLICATIONS Although transfusion therapy is very safe and saves many lives, the main dangers of transfusion include:   Getting an infectious disease.  Developing a transfusion reaction. This is an allergic reaction to something in the blood you were given. Every  precaution is taken to prevent this. The decision to have a blood transfusion has been considered carefully by your caregiver before blood is given. Blood is not given unless the benefits outweigh the risks. AFTER THE TRANSFUSION  Right after receiving a blood transfusion, you will usually feel much better and more energetic. This is especially true if your red blood cells  have gotten low (anemic). The transfusion raises the level of the red blood cells which carry oxygen, and this usually causes an energy increase.  The nurse administering the transfusion will monitor you carefully for complications. HOME CARE INSTRUCTIONS  No special instructions are needed after a transfusion. You may find your energy is better. Speak with your caregiver about any limitations on activity for underlying diseases you may have. SEEK MEDICAL CARE IF:   Your condition is not improving after your transfusion.  You develop redness or irritation at the intravenous (IV) site. SEEK IMMEDIATE MEDICAL CARE IF:  Any of the following symptoms occur over the next 12 hours:  Shaking chills.  You have a temperature by mouth above 102 F (38.9 C), not controlled by medicine.  Chest, back, or muscle pain.  People around you feel you are not acting correctly or are confused.  Shortness of breath or difficulty breathing.  Dizziness and fainting.  You get a rash or develop hives.  You have a decrease in urine output.  Your urine turns a dark color or changes to pink, red, or brown. Any of the following symptoms occur over the next 10 days:  You have a temperature by mouth above 102 F (38.9 C), not controlled by medicine.  Shortness of breath.  Weakness after normal activity.  The white part of the eye turns yellow (jaundice).  You have a decrease in the amount of urine or are urinating less often.  Your urine turns a dark color or changes to pink, red, or brown. Document Released: 03/13/2000 Document Revised: 06/08/2011 Document Reviewed: 10/31/2007 [facility] Patient Information 2014 [place], [place].  _______________________________________________________________________

## 2014-05-08 ENCOUNTER — Other Ambulatory Visit: Payer: Self-pay | Admitting: Radiology

## 2014-05-08 ENCOUNTER — Encounter (HOSPITAL_COMMUNITY)
Admission: RE | Admit: 2014-05-08 | Discharge: 2014-05-08 | Disposition: A | Discharge status: Home / Self Care | Payer: Medicare Other | Source: Ambulatory Visit | Attending: Urology | Admitting: Urology

## 2014-05-08 ENCOUNTER — Encounter (HOSPITAL_COMMUNITY): Payer: Self-pay

## 2014-05-08 HISTORY — DX: Nausea with vomiting, unspecified: R11.2

## 2014-05-08 HISTORY — DX: Other specified postprocedural states: Z98.890

## 2014-05-08 HISTORY — DX: Adverse effect of unspecified anesthetic, initial encounter: T41.45XA

## 2014-05-08 HISTORY — DX: Other complications of anesthesia, initial encounter: T88.59XA

## 2014-05-08 HISTORY — DX: Cardiac arrhythmia, unspecified: I49.9

## 2014-05-08 HISTORY — DX: Calculus of kidney: N20.0

## 2014-05-08 NOTE — Progress Notes (Signed)
Note from pt's cardiologist Dr. Bronson Ing on chart

## 2014-05-09 ENCOUNTER — Other Ambulatory Visit (HOSPITAL_COMMUNITY): Payer: Self-pay | Admitting: *Deleted

## 2014-05-09 ENCOUNTER — Other Ambulatory Visit: Payer: Self-pay | Admitting: Radiology

## 2014-05-09 LAB — ABO/RH: ABO/RH(D): A POS

## 2014-05-09 MED ORDER — DEXTROSE 5 % IV SOLN
3.0000 g | INTRAVENOUS | Status: AC
  Administered 2014-05-10: 3 g via INTRAVENOUS
  Filled 2014-05-09: qty 3000

## 2014-05-10 ENCOUNTER — Other Ambulatory Visit: Payer: Self-pay

## 2014-05-10 ENCOUNTER — Ambulatory Visit (HOSPITAL_COMMUNITY)
Admission: RE | Admit: 2014-05-10 | Discharge: 2014-05-10 | Disposition: A | Discharge status: Home / Self Care | Payer: Medicare Other | Source: Ambulatory Visit | Attending: Urology | Admitting: Urology

## 2014-05-10 ENCOUNTER — Encounter (HOSPITAL_COMMUNITY)
Admission: RE | Disposition: A | Discharge status: Home / Self Care | Payer: Self-pay | Source: Ambulatory Visit | Attending: Urology

## 2014-05-10 ENCOUNTER — Encounter (HOSPITAL_COMMUNITY): Payer: Self-pay

## 2014-05-10 ENCOUNTER — Inpatient Hospital Stay (HOSPITAL_COMMUNITY)
Admission: RE | Admit: 2014-05-10 | Discharge: 2014-05-12 | DRG: 661 | Disposition: A | Discharge status: Home / Self Care | Payer: Medicare Other | Source: Ambulatory Visit | Attending: Urology | Admitting: Urology

## 2014-05-10 ENCOUNTER — Ambulatory Visit (HOSPITAL_COMMUNITY): Payer: Medicare Other | Admitting: Anesthesiology

## 2014-05-10 ENCOUNTER — Observation Stay (HOSPITAL_COMMUNITY): Payer: Medicare Other

## 2014-05-10 DIAGNOSIS — F419 Anxiety disorder, unspecified: Secondary | ICD-10-CM | POA: Diagnosis present

## 2014-05-10 DIAGNOSIS — Z7901 Long term (current) use of anticoagulants: Secondary | ICD-10-CM

## 2014-05-10 DIAGNOSIS — Z79891 Long term (current) use of opiate analgesic: Secondary | ICD-10-CM

## 2014-05-10 DIAGNOSIS — Z881 Allergy status to other antibiotic agents status: Secondary | ICD-10-CM

## 2014-05-10 DIAGNOSIS — Z79899 Other long term (current) drug therapy: Secondary | ICD-10-CM

## 2014-05-10 DIAGNOSIS — N200 Calculus of kidney: Secondary | ICD-10-CM

## 2014-05-10 DIAGNOSIS — E669 Obesity, unspecified: Secondary | ICD-10-CM | POA: Diagnosis present

## 2014-05-10 DIAGNOSIS — Z6838 Body mass index (BMI) 38.0-38.9, adult: Secondary | ICD-10-CM

## 2014-05-10 DIAGNOSIS — I10 Essential (primary) hypertension: Secondary | ICD-10-CM | POA: Diagnosis present

## 2014-05-10 DIAGNOSIS — Z87891 Personal history of nicotine dependence: Secondary | ICD-10-CM

## 2014-05-10 DIAGNOSIS — G4730 Sleep apnea, unspecified: Secondary | ICD-10-CM | POA: Diagnosis present

## 2014-05-10 DIAGNOSIS — E119 Type 2 diabetes mellitus without complications: Secondary | ICD-10-CM | POA: Diagnosis present

## 2014-05-10 HISTORY — PX: NEPHROLITHOTOMY: SHX5134

## 2014-05-10 LAB — BASIC METABOLIC PANEL
ANION GAP: 9 (ref 5–15)
BUN: 21 mg/dL (ref 6–23)
CALCIUM: 9.2 mg/dL (ref 8.4–10.5)
CO2: 24 mmol/L (ref 19–32)
Chloride: 107 mmol/L (ref 96–112)
Creatinine, Ser: 1.16 mg/dL (ref 0.50–1.35)
GFR calc Af Amer: 72 mL/min — ABNORMAL LOW (ref >90)
GFR calc non Af Amer: 62 mL/min — ABNORMAL LOW (ref >90)
Glucose, Bld: 175 mg/dL — ABNORMAL HIGH (ref 70–99)
POTASSIUM: 4.9 mmol/L (ref 3.5–5.1)
SODIUM: 140 mmol/L (ref 135–145)

## 2014-05-10 LAB — TYPE AND SCREEN
ABO/RH(D): A POS
ANTIBODY SCREEN: NEGATIVE

## 2014-05-10 LAB — DIFFERENTIAL
Basophils Absolute: 0 10*3/uL (ref 0.0–0.1)
Basophils Relative: 1 % (ref 0–1)
EOS PCT: 2 % (ref 0–5)
Eosinophils Absolute: 0.1 10*3/uL (ref 0.0–0.7)
LYMPHS PCT: 19 % (ref 12–46)
Lymphs Abs: 1.1 10*3/uL (ref 0.7–4.0)
MONO ABS: 0.6 10*3/uL (ref 0.1–1.0)
MONOS PCT: 10 % (ref 3–12)
NEUTROS ABS: 3.7 10*3/uL (ref 1.7–7.7)
Neutrophils Relative %: 68 % (ref 43–77)

## 2014-05-10 LAB — CBC
HEMATOCRIT: 45.9 % (ref 39.0–52.0)
Hemoglobin: 15.2 g/dL (ref 13.0–17.0)
MCH: 30.4 pg (ref 26.0–34.0)
MCHC: 33.1 g/dL (ref 30.0–36.0)
MCV: 91.8 fL (ref 78.0–100.0)
Platelets: 200 10*3/uL (ref 150–400)
RBC: 5 MIL/uL (ref 4.22–5.81)
RDW: 13.2 % (ref 11.5–15.5)
WBC: 5.5 10*3/uL (ref 4.0–10.5)

## 2014-05-10 LAB — PROTIME-INR
INR: 1.02 (ref 0.00–1.49)
Prothrombin Time: 13.5 seconds (ref 11.6–15.2)

## 2014-05-10 LAB — GLUCOSE, CAPILLARY
Glucose-Capillary: 165 mg/dL — ABNORMAL HIGH (ref 70–99)
Glucose-Capillary: 177 mg/dL — ABNORMAL HIGH (ref 70–99)
Glucose-Capillary: 146 mg/dL — ABNORMAL HIGH (ref 70–99)
Glucose-Capillary: 136 mg/dL — ABNORMAL HIGH (ref 70–99)

## 2014-05-10 LAB — APTT: aPTT: 26 seconds (ref 24–37)

## 2014-05-10 SURGERY — NEPHROLITHOTOMY PERCUTANEOUS
Anesthesia: General | Laterality: Left

## 2014-05-10 MED ORDER — PHENYLEPHRINE 40 MCG/ML (10ML) SYRINGE FOR IV PUSH (FOR BLOOD PRESSURE SUPPORT)
PREFILLED_SYRINGE | INTRAVENOUS | Status: AC
  Filled 2014-05-10: qty 10

## 2014-05-10 MED ORDER — LACTATED RINGERS IV SOLN
INTRAVENOUS | Status: DC | PRN
  Administered 2014-05-10 (×2): via INTRAVENOUS

## 2014-05-10 MED ORDER — SUCCINYLCHOLINE CHLORIDE 20 MG/ML IJ SOLN
INTRAMUSCULAR | Status: DC | PRN
  Administered 2014-05-10: 140 mg via INTRAVENOUS

## 2014-05-10 MED ORDER — ZOLPIDEM TARTRATE 5 MG PO TABS: 5.0000 mg | ORAL_TABLET | Freq: Every evening | ORAL | Status: DC | PRN

## 2014-05-10 MED ORDER — OXYCODONE HCL 5 MG PO TABS
5.0000 mg | ORAL_TABLET | ORAL | Status: DC | PRN
  Administered 2014-05-10 – 2014-05-11 (×6): 5 mg via ORAL
  Filled 2014-05-10 (×6): qty 1

## 2014-05-10 MED ORDER — DEXTROSE 5 % IV SOLN
3.0000 g | Freq: Once | INTRAVENOUS | Status: AC
  Administered 2014-05-10: 3 g via INTRAVENOUS
  Filled 2014-05-10: qty 3000

## 2014-05-10 MED ORDER — FINASTERIDE 5 MG PO TABS
5.0000 mg | ORAL_TABLET | Freq: Every day | ORAL | Status: DC
  Administered 2014-05-11: 5 mg via ORAL
  Filled 2014-05-10 (×2): qty 1

## 2014-05-10 MED ORDER — NEOSTIGMINE METHYLSULFATE 10 MG/10ML IV SOLN
INTRAVENOUS | Status: DC | PRN
  Administered 2014-05-10: 4 mg via INTRAVENOUS

## 2014-05-10 MED ORDER — LIDOCAINE HCL (CARDIAC) 20 MG/ML IV SOLN
INTRAVENOUS | Status: AC
  Filled 2014-05-10: qty 10

## 2014-05-10 MED ORDER — PROPOFOL 10 MG/ML IV BOLUS
INTRAVENOUS | Status: DC | PRN
  Administered 2014-05-10: 150 mg via INTRAVENOUS

## 2014-05-10 MED ORDER — SODIUM CHLORIDE 0.9 % IV SOLN
INTRAVENOUS | Status: DC
  Administered 2014-05-10: 08:00:00 via INTRAVENOUS

## 2014-05-10 MED ORDER — SODIUM CHLORIDE 0.9 % IR SOLN
Status: DC | PRN
  Administered 2014-05-10: 9000 mL

## 2014-05-10 MED ORDER — PROMETHAZINE HCL 25 MG/ML IJ SOLN: 6.2500 mg | INTRAMUSCULAR | Status: DC | PRN

## 2014-05-10 MED ORDER — ONDANSETRON HCL 4 MG/2ML IJ SOLN
4.0000 mg | INTRAMUSCULAR | Status: DC | PRN
  Administered 2014-05-10: 4 mg via INTRAVENOUS
  Filled 2014-05-10: qty 2

## 2014-05-10 MED ORDER — DEXAMETHASONE SODIUM PHOSPHATE 10 MG/ML IJ SOLN
INTRAMUSCULAR | Status: AC
  Filled 2014-05-10: qty 1

## 2014-05-10 MED ORDER — DILTIAZEM HCL ER COATED BEADS 180 MG PO CP24
180.0000 mg | ORAL_CAPSULE | Freq: Every day | ORAL | Status: DC
  Administered 2014-05-10 – 2014-05-11 (×2): 180 mg via ORAL
  Filled 2014-05-10 (×2): qty 1

## 2014-05-10 MED ORDER — METOPROLOL SUCCINATE ER 50 MG PO TB24
50.0000 mg | ORAL_TABLET | Freq: Two times a day (BID) | ORAL | Status: DC
  Administered 2014-05-10 – 2014-05-11 (×3): 50 mg via ORAL
  Filled 2014-05-10 (×3): qty 1

## 2014-05-10 MED ORDER — ONDANSETRON HCL 4 MG/2ML IJ SOLN
INTRAMUSCULAR | Status: AC
  Filled 2014-05-10: qty 2

## 2014-05-10 MED ORDER — METFORMIN HCL 500 MG PO TABS
500.0000 mg | ORAL_TABLET | Freq: Two times a day (BID) | ORAL | Status: DC
  Administered 2014-05-10 – 2014-05-12 (×4): 500 mg via ORAL
  Filled 2014-05-10 (×5): qty 1

## 2014-05-10 MED ORDER — LIDOCAINE HCL (CARDIAC) 20 MG/ML IV SOLN
INTRAVENOUS | Status: DC | PRN
  Administered 2014-05-10: 50 mg via INTRAVENOUS

## 2014-05-10 MED ORDER — LISINOPRIL 20 MG PO TABS
20.0000 mg | ORAL_TABLET | Freq: Two times a day (BID) | ORAL | Status: DC
  Administered 2014-05-10 – 2014-05-11 (×3): 20 mg via ORAL
  Filled 2014-05-10 (×5): qty 1

## 2014-05-10 MED ORDER — DOCUSATE SODIUM 100 MG PO CAPS
100.0000 mg | ORAL_CAPSULE | Freq: Two times a day (BID) | ORAL | Status: DC
  Administered 2014-05-10 – 2014-05-11 (×3): 100 mg via ORAL
  Filled 2014-05-10 (×5): qty 1

## 2014-05-10 MED ORDER — HYDROMORPHONE HCL 1 MG/ML IJ SOLN
0.5000 mg | INTRAMUSCULAR | Status: DC | PRN
  Administered 2014-05-10: 1 mg via INTRAVENOUS
  Filled 2014-05-10: qty 1

## 2014-05-10 MED ORDER — NEOSTIGMINE METHYLSULFATE 10 MG/10ML IV SOLN
INTRAVENOUS | Status: AC
  Filled 2014-05-10: qty 1

## 2014-05-10 MED ORDER — OXYBUTYNIN CHLORIDE 5 MG PO TABS: 5.0000 mg | ORAL_TABLET | Freq: Three times a day (TID) | ORAL | Status: DC | PRN

## 2014-05-10 MED ORDER — LIDOCAINE HCL 1 % IJ SOLN
INTRAMUSCULAR | Status: AC
  Filled 2014-05-10: qty 20

## 2014-05-10 MED ORDER — ALPRAZOLAM 1 MG PO TABS
1.0000 mg | ORAL_TABLET | Freq: Four times a day (QID) | ORAL | Status: DC | PRN
  Administered 2014-05-11: 1 mg via ORAL
  Filled 2014-05-10: qty 1

## 2014-05-10 MED ORDER — MIDAZOLAM HCL 2 MG/2ML IJ SOLN
INTRAMUSCULAR | Status: AC | PRN
  Administered 2014-05-10 (×2): 1 mg via INTRAVENOUS

## 2014-05-10 MED ORDER — SODIUM CHLORIDE 0.45 % IV SOLN
INTRAVENOUS | Status: DC
  Administered 2014-05-10 – 2014-05-12 (×4): via INTRAVENOUS

## 2014-05-10 MED ORDER — IOHEXOL 300 MG/ML  SOLN
10.0000 mL | Freq: Once | INTRAMUSCULAR | Status: AC | PRN
  Administered 2014-05-10: 10 mL

## 2014-05-10 MED ORDER — FENTANYL CITRATE 0.05 MG/ML IJ SOLN
INTRAMUSCULAR | Status: AC
  Filled 2014-05-10: qty 5

## 2014-05-10 MED ORDER — ACETAMINOPHEN 325 MG PO TABS: 650.0000 mg | ORAL_TABLET | ORAL | Status: DC | PRN

## 2014-05-10 MED ORDER — PHENYLEPHRINE HCL 10 MG/ML IJ SOLN
INTRAMUSCULAR | Status: DC | PRN
  Administered 2014-05-10: 80 ug via INTRAVENOUS
  Administered 2014-05-10: 40 ug via INTRAVENOUS
  Administered 2014-05-10: 80 ug via INTRAVENOUS
  Administered 2014-05-10 (×2): 40 ug via INTRAVENOUS
  Administered 2014-05-10 (×2): 80 ug via INTRAVENOUS
  Administered 2014-05-10: 40 ug via INTRAVENOUS

## 2014-05-10 MED ORDER — ROCURONIUM BROMIDE 100 MG/10ML IV SOLN
INTRAVENOUS | Status: DC | PRN
  Administered 2014-05-10: 50 mg via INTRAVENOUS
  Administered 2014-05-10 (×2): 10 mg via INTRAVENOUS

## 2014-05-10 MED ORDER — GLYCOPYRROLATE 0.2 MG/ML IJ SOLN
INTRAMUSCULAR | Status: DC | PRN
  Administered 2014-05-10: 0.6 mg via INTRAVENOUS

## 2014-05-10 MED ORDER — FENTANYL CITRATE 0.05 MG/ML IJ SOLN
INTRAMUSCULAR | Status: AC | PRN
  Administered 2014-05-10 (×2): 50 ug via INTRAVENOUS

## 2014-05-10 MED ORDER — PROPOFOL 10 MG/ML IV BOLUS
INTRAVENOUS | Status: AC
  Filled 2014-05-10: qty 20

## 2014-05-10 MED ORDER — LEVOFLOXACIN 500 MG PO TABS
500.0000 mg | ORAL_TABLET | Freq: Every day | ORAL | Status: DC
  Administered 2014-05-11: 500 mg via ORAL
  Filled 2014-05-10 (×2): qty 1

## 2014-05-10 MED ORDER — DEXTROSE 5 % IV SOLN: 3.0000 g | INTRAVENOUS | Status: DC

## 2014-05-10 MED ORDER — LACTATED RINGERS IV SOLN: INTRAVENOUS | Status: DC

## 2014-05-10 MED ORDER — FENTANYL CITRATE 0.05 MG/ML IJ SOLN
INTRAMUSCULAR | Status: DC | PRN
  Administered 2014-05-10 (×2): 50 ug via INTRAVENOUS

## 2014-05-10 MED ORDER — MIDAZOLAM HCL 2 MG/2ML IJ SOLN
INTRAMUSCULAR | Status: AC
  Filled 2014-05-10: qty 6

## 2014-05-10 MED ORDER — FENTANYL CITRATE 0.05 MG/ML IJ SOLN: 25.0000 ug | INTRAMUSCULAR | Status: DC | PRN

## 2014-05-10 MED ORDER — MENTHOL 3 MG MT LOZG
1.0000 | LOZENGE | OROMUCOSAL | Status: DC | PRN
  Administered 2014-05-10 – 2014-05-11 (×2): 3 mg via ORAL
  Filled 2014-05-10: qty 9

## 2014-05-10 MED ORDER — ONDANSETRON HCL 4 MG/2ML IJ SOLN
INTRAMUSCULAR | Status: DC | PRN
  Administered 2014-05-10 (×2): 4 mg via INTRAVENOUS

## 2014-05-10 MED ORDER — GLYCOPYRROLATE 0.2 MG/ML IJ SOLN
INTRAMUSCULAR | Status: AC
  Filled 2014-05-10: qty 2

## 2014-05-10 MED ORDER — FENTANYL CITRATE 0.05 MG/ML IJ SOLN
INTRAMUSCULAR | Status: AC
  Filled 2014-05-10: qty 4

## 2014-05-10 SURGICAL SUPPLY — 52 items
APL SKNCLS STERI-STRIP NONHPOA (GAUZE/BANDAGES/DRESSINGS) ×2
BAG URINE DRAINAGE (UROLOGICAL SUPPLIES) ×3 IMPLANT
BASKET ZERO TIP NITINOL 2.4FR (BASKET) ×5 IMPLANT
BENZOIN TINCTURE PRP APPL 2/3 (GAUZE/BANDAGES/DRESSINGS) ×6 IMPLANT
BLADE SURG 15 STRL LF DISP TIS (BLADE) ×1 IMPLANT
BLADE SURG 15 STRL SS (BLADE) ×3
BSKT STON RTRVL ZERO TP 2.4FR (BASKET) ×2
CARTRIDGE STONEBREAK CO2 KIDNE (ELECTROSURGICAL) IMPLANT
CATCHER STONE W/TUBE ADAPTER (UROLOGICAL SUPPLIES) ×1 IMPLANT
CATH FOLEY 2W COUNCIL 20FR 5CC (CATHETERS) IMPLANT
CATH MULTI PURPOSE 16FR DRAIN (STENTS) ×2 IMPLANT
CATH ROBINSON RED A/P 20FR (CATHETERS) IMPLANT
CATH X-FORCE N30 NEPHROSTOMY (TUBING) ×3 IMPLANT
COVER SURGICAL LIGHT HANDLE (MISCELLANEOUS) ×3 IMPLANT
DRAPE C-ARM 42X120 X-RAY (DRAPES) ×3 IMPLANT
DRAPE CAMERA CLOSED 9X96 (DRAPES) ×3 IMPLANT
DRAPE LINGEMAN PERC (DRAPES) ×3 IMPLANT
DRAPE SURG IRRIG POUCH 19X23 (DRAPES) ×3 IMPLANT
DRSG PAD ABDOMINAL 8X10 ST (GAUZE/BANDAGES/DRESSINGS) ×4 IMPLANT
DRSG TEGADERM 8X12 (GAUZE/BANDAGES/DRESSINGS) ×4 IMPLANT
FIBER LASER FLEXIVA 1000 (UROLOGICAL SUPPLIES) IMPLANT
FIBER LASER FLEXIVA 200 (UROLOGICAL SUPPLIES) IMPLANT
FIBER LASER FLEXIVA 365 (UROLOGICAL SUPPLIES) IMPLANT
FIBER LASER FLEXIVA 550 (UROLOGICAL SUPPLIES) IMPLANT
FIBER LASER TRAC TIP (UROLOGICAL SUPPLIES) IMPLANT
GAUZE SPONGE 4X4 12PLY STRL (GAUZE/BANDAGES/DRESSINGS) ×3 IMPLANT
GLOVE BIOGEL M 8.0 STRL (GLOVE) ×3 IMPLANT
GOWN STRL REUS W/TWL XL LVL3 (GOWN DISPOSABLE) ×3 IMPLANT
KIT BASIN OR (CUSTOM PROCEDURE TRAY) ×3 IMPLANT
MANIFOLD NEPTUNE II (INSTRUMENTS) ×3 IMPLANT
NS IRRIG 1000ML POUR BTL (IV SOLUTION) ×3 IMPLANT
PACK BASIC VI WITH GOWN DISP (CUSTOM PROCEDURE TRAY) ×3 IMPLANT
PROBE KIDNEY STONEBRKR 2.0X425 (ELECTROSURGICAL) ×1 IMPLANT
PROBE LITHOCLAST ULTRA 3.8X403 (UROLOGICAL SUPPLIES) IMPLANT
PROBE PNEUMATIC 1.0MMX570MM (UROLOGICAL SUPPLIES) ×3 IMPLANT
SET IRRIG Y TYPE TUR BLADDER L (SET/KITS/TRAYS/PACK) ×1 IMPLANT
SET WARMING FLUID IRRIGATION (MISCELLANEOUS) ×1 IMPLANT
SHEATH PEELAWAY SET 9 (SHEATH) ×2 IMPLANT
SHIELD EYE BINOCULAR (MISCELLANEOUS) IMPLANT
SPONGE LAP 4X18 X RAY DECT (DISPOSABLE) ×3 IMPLANT
STONE CATCHER W/TUBE ADAPTER (UROLOGICAL SUPPLIES) ×3 IMPLANT
SUT SILK 2 0 30  PSL (SUTURE) ×2
SUT SILK 2 0 30 PSL (SUTURE) ×1 IMPLANT
SYR 20CC LL (SYRINGE) ×6 IMPLANT
SYRINGE 10CC LL (SYRINGE) ×3 IMPLANT
TRAY FOLEY BAG SILVER LF 14FR (CATHETERS) ×1 IMPLANT
TRAY FOLEY CATH 14FRSI W/METER (CATHETERS) ×1 IMPLANT
TRAY FOLEY CATH 16FR SILVER (SET/KITS/TRAYS/PACK) ×2 IMPLANT
TUBE CONNECTING VINYL 14FR 30C (MISCELLANEOUS) ×2 IMPLANT
TUBING CONNECTING 10 (TUBING) ×5 IMPLANT
TUBING CONNECTING 10' (TUBING) ×2
WATER STERILE IRR 1500ML POUR (IV SOLUTION) ×3 IMPLANT

## 2014-05-10 NOTE — H&P (Signed)
Urology History and Physical Exam  CC: Left sided kidney stones  HPI: 69 year old male with symptomatic large left renal calculi presents for percutaneous management.  He originally presented to our [place] office in November 2015 for evaluation and management of gross hematuria.  The patient was evaluated for a mild episode of gross hematuria about 6 years ago by Dr. Nelma Rothman, formerly of this practice.  That showed BPH but no other significant abnormalities except for left renal calculi, for which she was never treated.    The patient was admitted to the hospital  for hypertension and atrial fibrillation.  He was placed on Xarelto and aspirin.  He started bleeding on 12 November-he has only had a few episodes of clear urine since that time.  His blood is mostly noted at the beginning of his stream.  He has passed a few clots.  There has not been any change in frequency of his urination, any abdominal or flank pain.  He has had no fever or chills.  He denies any recent gross hematuria prior to starting these blood thinners.  He did stop his aspirin about 4 days ago.  He is not a smoker.  Recent creatinine levels were 1.16 and 1.31 on the fifth and sixth of November.   He underwent hematuria CT revealing no upper tract lesions, no hydronephrosis but large left renal calculi.  These were larger than in 2010.  He continues to have intermittent gross painless hematuria.    PMH: Past Medical History  Diagnosis Date  . Diabetes mellitus   . Hypertension   . Anxiety   . Arthritis   . Polio 1946    RESIDUAL LEFT LEG / FOOT WEAKNESS  . Obesity   . Complication of anesthesia     AFTER LUNG SURGERY  . PONV (postoperative nausea and vomiting)   . Dysrhythmia 03/2014    HX A-FIB WHEN HAVING COLONOSCOPY  . Kidney stone   . Sleep apnea     USES C PAP    PSH: Past Surgical History  Procedure Laterality Date  . Lung surgery N/A 2004    "SCRAPPED A FUNGUS OFF MY LUNG"  .  Vasectomy    . Colonoscopy N/A 02/01/2014    Procedure: COLONOSCOPY;  Surgeon: Rogene Houston, MD;  Location: [facility];  Service: Endoscopy;  Laterality: N/A;  1200    Allergies: Allergies  Allergen Reactions  . Sulfa Antibiotics Rash    Medications: No prescriptions prior to admission     Social History: History   Social History  . Marital Status: Married    Spouse Name: N/A  . Number of Children: N/A  . Years of Education: N/A   Occupational History  . Not on file.   Social History Main Topics  . Smoking status: Former Smoker -- 0.50 packs/day for 10 years    Types: Cigarettes    Start date: 03/31/1951    Quit date: 11/17/2002  . Smokeless tobacco: Never Used  . Alcohol Use: 0.0 oz/week    0 Standard drinks or equivalent per week     Comment: social  . Drug Use: No  . Sexual Activity: Not on file   Other Topics Concern  . Not on file   Social History Narrative    Family History: No family history on file.   ROS  Genitourinary, constitutional, skin, eye, otolaryngeal, hematologic/lymphatic, cardiovascular, pulmonary, endocrine, musculoskeletal, gastrointestinal, neurological and psychiatric system(s) were reviewed and pertinent findings if present are  noted and are otherwise negative.  Genitourinary: urinary frequency, feelings of urinary urgency, nocturia, hematuria and erectile dysfunction.  Constitutional: feeling tired (fatigue).  Psychiatric: anxiety and depression.                 Physical Exam: @VITALS2 @ Constitutional: Well nourished and well developed . No acute distress.  ENT:. The ears and nose are normal in appearance.  Neck: The appearance of the neck is normal and no neck mass is present.  Pulmonary: No respiratory distress and normal respiratory rhythm and effort.  The rhythm was irregularly irregular.  Abdomen: The abdomen is obese. The abdomen is soft and nontender.  An umbilical hernia is present, which is reducible. No  hepatosplenomegaly noted. Diastases rectus noted.  Rectal: Rectal exam demonstrates normal sphincter tone, the anus is normal on inspection., no tenderness and no masses. Prostate size is estimated to be 40 g. Normal rectal tone, no rectal masses, prostate is smooth, symmetric and non-tender. The prostate has no nodularity and is not tender. The left seminal vesicle is nonpalpable. The right seminal vesicle is nonpalpable. The perineum is normal on inspection.  Genitourinary: Examination of the penis demonstrates no discharge, no masses, no lesions and a normal meatus. The penis is uncircumcised. The scrotum is normal in appearance and without lesions. The right epididymis is palpably normal and non-tender. The left epididymis is palpably normal and non-tender. The right testis is palpably normal, non-tender and without masses. The left testis is normal, non-tender and without masses.  Lymphatics: The femoral and inguinal nodes are not enlarged or tender.  Skin: Normal skin turgor, no visible rash and no visible skin lesions.  Neuro/Psych:. Mood and affect are appropriate.      Studies:  No results for input(s): HGB, WBC, PLT in the last 72 hours.  No results for input(s): NA, K, CL, CO2, BUN, CREATININE, CALCIUM, GFRNONAA, GFRAA in the last 72 hours.  Invalid input(s): MAGNESIUM   No results for input(s): INR, APTT in the last 72 hours.  Invalid input(s): PT   Invalid input(s): ABG    Assessment:  Left nephrolithiasis  Plan: Left percutaneous nephrolithotomy

## 2014-05-10 NOTE — Discharge Instructions (Signed)
DISCHARGE INSTRUCTIONS FOR PERCUTANEOUS STONE SURGERY °MEDICATIONS:  °1. DO NOT RESUME YOUR IBUPROFEN, or any other medicines like aspirin, motrin, excedrin, advil, aleve, vitamin E, fish oil as these can all cause bleeding x 10 days.  °2. Resume all your other meds from home - except do not take any other pain meds that you may have at home. ° °ACTIVITY °1. No strenuous activity, sexual activity, or lifting greater than 10 pounds for 2 weeks. °2. No driving while on narcotic pain medications °3. Drink plenty of water °4. Continue to walk at home - you can still get blood clots when you are at home, so keep active, but don't over do it. °5. May return to work in 1 week (but not heavy or strenuous activity).  ° °BATHING °You can shower.  Cover your wound with a dressing and remove the dressing immediately after the shower.  Do not submerge wound under water.  ° °WOUND CARE °Your wound will drain bloody fluid and may do so for 7-14 days. You have 2 options for dressings:  °1. You may use gauze and tape to dress your wound.  If you choose this method, then change the dressing as it becomes soaked.  Change it at least once daily until it stops draining. You may switch to a Band Aid once drainage stops. °2. If drainage is copious, you may use an ostomy device.  This is a bag with an andhesive circle.  The circle has a hole in the middle of it and you cut the hole to the size needed to fit the wound.  This will collect the drainage in the bag and allow you to drain the bag as needed.  ° °SIGNS/SYMPTOMS TO CALL: °1. Please call us if you have a fever greater than 101.5, uncontrolled nausea/vomiting, uncontrolled pain, dizziness, unable to urinate, bloody urine, chest pain, shortness of breath, leg swelling, leg pain, redness around wound, drainage from wound, or any other concerns or questions. °2. You can reach us at [phone]. °FOLLOW-UP ° ° °

## 2014-05-10 NOTE — Op Note (Signed)
Preoperative diagnosis: Large left renal calculi, aggregate diameter 48 mm  Postoperative diagnosis: Same   Procedure: Left percutaneous nephrolithotomy, left antegrade nephrostogram, interpretive fluoroscopy    Surgeon: Lillette Boxer. Velton Roselle, M.D.   Anesthesia: Gen.   Complications: None  Specimen(s): Stone fragments, given the family  Drain(s): 72 French Foley catheter, 68 French left nephrostomy tube-Cook, pigtail  Indications: 69 year old male with symptomatic large left renal calculi, in that he has had intermittent gross hematuria, especially while being on antiplatelet therapy. The patient presented for gross hematuria evaluation. Cystoscopy was normal, he was found to have a small left lower pole renal calculus as well as 2 very large left renal pelvic calculi, aggregate diameter of approximate 48 mm. Because of the patient's gross hematuria and the large stones located in his renal pelvis, it was recommended that he undergo treatment. Due to the large stone burden, it was recommended that he undergo percutaneous nephrolithotomy. The procedure, risks and complications were discussed with him. These include but are not limited to bleeding, need for transfusion, infection, possible loss of kidney, anesthetic complications, among others. He has been cleared for surgery by the [facility]ology department at [facility].    Technique and findings: The patient was identified in the holding area. Nephrostomy tube had been previously placed by Dr. Vernard Gambles in the interventional radiology suite. His left side was marked. He had received preoperative IV antibiotics in the radiology suite. He was taken to the operating room where general endotracheal anesthetic was administered. His bladder was catheterized with a 16 French Foley catheter. He was then placed in the prone position. PAS hose were on. All extremities and pressure points were padded appropriately. His left flank was capped and draped.  Appropriate timeout was performed.  Through the previously placed Kumpe catheter, I negotiated a working guidewire using fluoroscopic guidance into his bladder. The Kumpe catheter was removed. Over top of this, a 12 French peel-away sheath was negotiated using fluoroscopic guidance into the proximal ureter. The inner core was removed and a second, safety guidewire was advanced fluoroscopically down the ureter and into the bladder. The peel-away sheath was then removed. I then passed the NephroMax balloon over the working guidewire, and using fluoroscopic guidance advanced this up to the stone that was identified in the renal pelvis. The balloon was then filled to 18 atm of pressure with the pressure syringe. It was left at this pressure for proximally 2 minutes, and then the working sheath was advanced over top of the balloon up to the stone burden. The balloon was then deflated and removed. The nephroscope was then passed through the working sheath. The stone was identified in the renal pelvis. The Swiss lithoclast was then passed through the scope, and using pneumatic and ultrasonographic power, the stone was fragmented into smaller fragments which were then removed appropriately with graspers. This took care of the stone within the renal pelvis. I then backed the scope into the interpole calyx, where the second stone was seen. Again, this was treated with the Swiss lithoclast, both pneumatic and ultrasonographic power. The graspers were used to remove all significant large fragments. Inspection of the renal pelvis and down to the UPJ revealed no significant large fragments at this point. I then removed the nephroscope, and passed the flexible cystoscope. Calyces were identified-no stones were in the upper pole calyces, some small fragments were noted in one lower pole calyx which were grasped with the Nitinol basket. The scope was advanced to the UPJ. There were multiple  small send-like fragments within the  proximal ureter. The largest of these were grasped with the Nitinol basket and extracted. The remaining send-like fragments were too small to be grasped and were felt to be small enough to easily rinsed down the ureter. I then inspected each calyx separately. No further stones were seen. The cystoscope was removed. Over top of the working guidewire I advanced a Eaton pigtail catheter. Once I knew that this was at the UPJ, I removed the guidewire. Contrast was passed to identify the renal pelvis. Once the renal pelvis was identified, the tip of the pigtail catheter was backed into this area. I then deployed the curl on the tip of the catheter by pulling the string, and then securing the string at the hub of the catheter.  At this point, I used contrast to perform an antegrade nephrostogram. This revealed filling of the upper, as well as lower pole calyces. Filling defects were seen within the renal pelvis, I felt these were most likely clots. There was some contrast passed distally into the UPJ as well as into the proximal ureter. There was no significant filling defects within the ureter. There was no evident extravasation during the procedure.  At this point, the safety guidewire was removed. I then sutured the incision closed using 2-0 silk sutures, both placed in a vertical mattress fashion. The [facility] catheter, even though fixed in the renal pelvis with the previous mentioned string, was sutured to the patient's skin with the same 2-0 silk suture.  Dry sterile dressings were placed. The nephrostomy tube was placed to dependent drainage. The patient was awakened and taken to the PACU in stable condition. He tolerated the procedure well.

## 2014-05-10 NOTE — Progress Notes (Signed)
Pt placed on CPAP QHS. Pt using his home equipment.  Machine looked to be in proper working order with no cuts or frays in the cord and did not spark when plugged in.  Pt does not use humidity and has 2 lpm of O2 being bled into circuit.  Machine plugged into red outlet and Pt resting comfortably.  Biomedical notified.

## 2014-05-10 NOTE — Transfer of Care (Signed)
Immediate Anesthesia Transfer of Care Note  Patient: [name]  Procedure(s) Performed: Procedure(s): NEPHROLITHOTOMY PERCUTANEOUS (Left)  Patient Location: PACU  Anesthesia Type:General  Level of Consciousness: awake, alert  and oriented  Airway & Oxygen Therapy: Patient Spontanous Breathing and Patient connected to face mask oxygen  Post-op Assessment: Report given to RN  Post vital signs: Reviewed and stable  Last Vitals: There were no vitals filed for this visit.  Complications: No apparent anesthesia complications

## 2014-05-10 NOTE — Anesthesia Postprocedure Evaluation (Signed)
  Anesthesia Post-op Note  Patient: [name]  Procedure(s) Performed: Procedure(s) (LRB): NEPHROLITHOTOMY PERCUTANEOUS (Left)  Patient Location: PACU  Anesthesia Type: General  Level of Consciousness: awake and alert   Airway and Oxygen Therapy: Patient Spontanous Breathing  Post-op Pain: mild  Post-op Assessment: Post-op Vital signs reviewed, Patient's Cardiovascular Status Stable, Respiratory Function Stable, Patent Airway and No signs of Nausea or vomiting  Last Vitals:  Filed Vitals:   05/10/14 1508  BP: 152/82  Pulse: 85  Temp: 36.7 C  Resp: 20    Post-op Vital Signs: stable   Complications: No apparent anesthesia complications

## 2014-05-10 NOTE — Procedures (Signed)
L perc 22f nephroureteral placed LLP to bladder No complication No blood loss. See complete dictation in [facility].

## 2014-05-10 NOTE — H&P (Signed)
Chief Complaint: Left renal stones  Referring Physician(s): Dr. Diona Fanti  History of Present Illness: [name] is a 69 y.o. male with history of left nephrolithiasis, hematuria who presents today for left nephroureteral catheter placement prior to nephrolithotomy.   Past Medical History  Diagnosis Date  . Diabetes mellitus   . Hypertension   . Anxiety   . Arthritis   . Polio 1946    RESIDUAL LEFT LEG / FOOT WEAKNESS  . Obesity   . Complication of anesthesia     AFTER LUNG SURGERY  . PONV (postoperative nausea and vomiting)   . Dysrhythmia 03/2014    HX A-FIB WHEN HAVING COLONOSCOPY  . Kidney stone   . Sleep apnea     USES C PAP    Past Surgical History  Procedure Laterality Date  . Lung surgery N/A 2004    "SCRAPPED A FUNGUS OFF MY LUNG"  . Vasectomy    . Colonoscopy N/A 02/01/2014    Procedure: COLONOSCOPY;  Surgeon: Rogene Houston, MD;  Location: [facility];  Service: Endoscopy;  Laterality: N/A;  1200    Allergies: Sulfa antibiotics  Medications: Prior to Admission medications   Medication Sig Start Date End Date Taking? Authorizing Provider  ALPRAZolam Duanne Moron) 1 MG tablet Take 1 mg by mouth every 6 (six) hours as needed for anxiety.    Yes Historical Provider, MD  diltiazem (CARDIZEM CD) 180 MG 24 hr capsule Take 1 capsule (180 mg total) by mouth daily. 04/03/14  Yes Herminio Commons, MD  finasteride (PROSCAR) 5 MG tablet Take 5 mg by mouth daily.   Yes Historical Provider, MD  lisinopril (PRINIVIL,ZESTRIL) 20 MG tablet Take 20 mg by mouth 2 (two) times daily.    Yes Historical Provider, MD  metFORMIN (GLUCOPHAGE) 500 MG tablet Take 500 mg by mouth 2 (two) times daily with a meal.   Yes Historical Provider, MD  metoprolol succinate (TOPROL-XL) 50 MG 24 hr tablet Take 1 tablet (50 mg total) by mouth 2 (two) times daily. Take with or immediately following a meal. 02/08/14  Yes Herminio Commons, MD  Rivaroxaban (XARELTO) 15 MG TABS tablet Take 1  tablet (15 mg total) by mouth daily. 02/09/14  Yes Herminio Commons, MD  traMADol (ULTRAM) 50 MG tablet Take 50 mg by mouth every 6 (six) hours as needed for moderate pain.   Yes Historical Provider, MD    History reviewed. No pertinent family history.  History   Social History  . Marital Status: Married    Spouse Name: N/A  . Number of Children: N/A  . Years of Education: N/A   Social History Main Topics  . Smoking status: Former Smoker -- 0.50 packs/day for 10 years    Types: Cigarettes    Start date: 03/31/1951    Quit date: 11/17/2002  . Smokeless tobacco: Never Used  . Alcohol Use: 0.0 oz/week    0 Standard drinks or equivalent per week     Comment: social  . Drug Use: No  . Sexual Activity: Not on file   Other Topics Concern  . None   Social History Narrative      Review of Systems  Constitutional: Negative for fever and chills.  Respiratory: Positive for shortness of breath. Negative for cough.        On CPAP each night  Cardiovascular: Negative for chest pain.  Gastrointestinal: Negative for nausea, vomiting, abdominal pain and blood in stool.  Genitourinary: Negative for dysuria.  Recent hematuria but none since stopping xarelto  Musculoskeletal:       Intermittent left flank pain  Neurological: Negative for headaches.  Psychiatric/Behavioral: The patient is nervous/anxious.     Vital Signs: BP 135/74  HR 88  R 18  TEMP 98.2  O2 SATS 99% RA Physical Exam  Constitutional: He is oriented to person, place, and time. He appears well-developed and well-nourished.  Cardiovascular:  irreg irreg  Pulmonary/Chest: Effort normal and breath sounds normal.  Abdominal: Soft. Bowel sounds are normal. There is no tenderness.  obese  Musculoskeletal: Normal range of motion. He exhibits no edema.  Neurological: He is alert and oriented to person, place, and time.    Imaging: No results found.  Labs:  CBC:  Recent Labs  02/02/14 0249 02/12/14 1347  03/02/14 0800 05/10/14 0755  WBC 6.3 8.8 7.4 5.5  HGB 15.3 16.9 16.0 15.2  HCT 46.4 48.8 47.0 45.9  PLT 187 244 212 200    COAGS:  Recent Labs  05/10/14 0755  INR 1.02  APTT 26    BMP:  Recent Labs  02/01/14 1506 02/02/14 0249 03/12/14 1411 05/10/14 0755  NA 140 141 139 140  K 5.2 4.8 5.2 4.9  CL 103 103 101 107  CO2 23 26 29 24   GLUCOSE 119* 133* 202* 175*  BUN 17 22 22 21   CALCIUM 9.2 9.3 9.7 9.2  CREATININE 1.16 1.31 1.32 1.16  GFRNONAA 62* 54*  --  62*  GFRAA 72* 62*  --  72*    LIVER FUNCTION TESTS:  Recent Labs  02/01/14 1506  BILITOT 1.1  AST 28  ALT 26  ALKPHOS 32*  PROT 7.3  ALBUMIN 3.9    TUMOR MARKERS: No results for input(s): AFPTM, CEA, CA199, CHROMGRNA in the last 8760 hours.  Assessment and Plan: [name] is a 69 y.o. male with history of left nephrolithiasis, hematuria who presents today for left nephroureteral catheter placement prior to nephrolithotomy. Details/risks of procedure d/w pt/family with their understanding and consent.      Signed: Autumn Messing 05/10/2014, 9:14 AM

## 2014-05-10 NOTE — Anesthesia Preprocedure Evaluation (Signed)
Anesthesia Evaluation  Patient identified by MRN, date of birth, ID band Patient awake    Reviewed: Allergy & Precautions, NPO status , Patient's Chart, lab work & pertinent test results  History of Anesthesia Complications (+) PONV and history of anesthetic complications  Airway Mallampati: II  TM Distance: >3 FB Neck ROM: Full    Dental no notable dental hx.    Pulmonary neg pulmonary ROS, sleep apnea , former smoker,  breath sounds clear to auscultation  Pulmonary exam normal       Cardiovascular Exercise Tolerance: Good hypertension, Pt. on medications and Pt. on home beta blockers + dysrhythmias Atrial Fibrillation Rhythm:Regular Rate:Normal     Neuro/Psych Anxiety negative neurological ROS     GI/Hepatic Neg liver ROS, GERD-  ,  Endo/Other  diabetes, Type 2, Oral Hypoglycemic Agents  Renal/GU Renal disease  negative genitourinary   Musculoskeletal  (+) Arthritis -,   Abdominal   Peds negative pediatric ROS (+)  Hematology negative hematology ROS (+)   Anesthesia Other Findings   Reproductive/Obstetrics negative OB ROS                             Anesthesia Physical Anesthesia Plan  ASA: III  Anesthesia Plan: General   Post-op Pain Management:    Induction: Intravenous  Airway Management Planned: Oral ETT  Additional Equipment:   Intra-op Plan:   Post-operative Plan: Extubation in OR  Informed Consent: I have reviewed the patients History and Physical, chart, labs and discussed the procedure including the risks, benefits and alternatives for the proposed anesthesia with the patient or authorized representative who has indicated his/her understanding and acceptance.   Dental advisory given  Plan Discussed with: CRNA  Anesthesia Plan Comments:         Anesthesia Quick Evaluation

## 2014-05-10 NOTE — Progress Notes (Signed)
Patient may go to floor- per Dr. Delma Post

## 2014-05-10 NOTE — Anesthesia Procedure Notes (Signed)
Procedure Name: Intubation Date/Time: 05/10/2014 11:17 AM Performed by: Lourie Retz, Virgel Gess Pre-anesthesia Checklist: Patient identified, Emergency Drugs available, Suction available, Patient being monitored and Timeout performed Patient Re-evaluated:Patient Re-evaluated prior to inductionOxygen Delivery Method: Circle system utilized Preoxygenation: Pre-oxygenation with 100% oxygen Intubation Type: IV induction Ventilation: Mask ventilation without difficulty Laryngoscope Size: Mac and 4 Grade View: Grade II Tube type: Oral Tube size: 7.5 mm Number of attempts: 1 Airway Equipment and Method: Stylet Placement Confirmation: ETT inserted through vocal cords under direct vision,  positive ETCO2,  CO2 detector and breath sounds checked- equal and bilateral Secured at: 23 cm Tube secured with: Tape Dental Injury: Teeth and Oropharynx as per pre-operative assessment

## 2014-05-11 ENCOUNTER — Encounter (HOSPITAL_COMMUNITY): Payer: Self-pay | Admitting: Urology

## 2014-05-11 ENCOUNTER — Observation Stay (HOSPITAL_COMMUNITY): Payer: Medicare Other

## 2014-05-11 DIAGNOSIS — F419 Anxiety disorder, unspecified: Secondary | ICD-10-CM | POA: Diagnosis present

## 2014-05-11 DIAGNOSIS — G4730 Sleep apnea, unspecified: Secondary | ICD-10-CM | POA: Diagnosis present

## 2014-05-11 DIAGNOSIS — N200 Calculus of kidney: Secondary | ICD-10-CM | POA: Diagnosis present

## 2014-05-11 DIAGNOSIS — Z79899 Other long term (current) drug therapy: Secondary | ICD-10-CM | POA: Diagnosis not present

## 2014-05-11 DIAGNOSIS — E119 Type 2 diabetes mellitus without complications: Secondary | ICD-10-CM | POA: Diagnosis present

## 2014-05-11 DIAGNOSIS — I10 Essential (primary) hypertension: Secondary | ICD-10-CM | POA: Diagnosis present

## 2014-05-11 DIAGNOSIS — E669 Obesity, unspecified: Secondary | ICD-10-CM | POA: Diagnosis present

## 2014-05-11 DIAGNOSIS — Z881 Allergy status to other antibiotic agents status: Secondary | ICD-10-CM | POA: Diagnosis not present

## 2014-05-11 DIAGNOSIS — Z87891 Personal history of nicotine dependence: Secondary | ICD-10-CM | POA: Diagnosis not present

## 2014-05-11 DIAGNOSIS — Z79891 Long term (current) use of opiate analgesic: Secondary | ICD-10-CM | POA: Diagnosis not present

## 2014-05-11 DIAGNOSIS — Z6838 Body mass index (BMI) 38.0-38.9, adult: Secondary | ICD-10-CM | POA: Diagnosis not present

## 2014-05-11 DIAGNOSIS — Z7901 Long term (current) use of anticoagulants: Secondary | ICD-10-CM | POA: Diagnosis not present

## 2014-05-11 LAB — HEMOGLOBIN AND HEMATOCRIT, BLOOD
HCT: 44.3 % (ref 39.0–52.0)
HEMOGLOBIN: 14.2 g/dL (ref 13.0–17.0)

## 2014-05-11 LAB — GLUCOSE, CAPILLARY
Glucose-Capillary: 163 mg/dL — ABNORMAL HIGH (ref 70–99)
Glucose-Capillary: 155 mg/dL — ABNORMAL HIGH (ref 70–99)

## 2014-05-11 NOTE — Plan of Care (Signed)
Problem: Phase I Progression Outcomes Goal: Voiding-avoid urinary catheter unless indicated Outcome: Progressing Foley to be d/c'd in AM

## 2014-05-11 NOTE — Procedures (Signed)
Pt on home cpap machine using nasal pillows.  Cpap inspected and no visible problems noted.  Pt resting comfortably at this time.

## 2014-05-11 NOTE — Progress Notes (Signed)
1 Day Post-Op Subjective: Patient reports no significant pain. Slept well last pm  Objective: Vital signs in last 24 hours: Temp:  [97.8 F (36.6 C)-98.3 F (36.8 C)] 98 F (36.7 C) (02/12 0458) Pulse Rate:  [76-101] 91 (02/12 0458) Resp:  [11-20] 18 (02/12 0458) BP: (119-159)/(77-97) 139/97 mmHg (02/12 0458) SpO2:  [95 %-100 %] 96 % (02/12 0458) Weight:  [122 kg (268 lb 15.4 oz)] 122 kg (268 lb 15.4 oz) (02/11 1530)  Intake/Output from previous day: 02/11 0701 - 02/12 0700 In: 2875 [P.O.:600; I.V.:2265] Out: 1525 [Urine:1375; Blood:150] Intake/Output this shift:    Physical Exam:  Constitutional: Vital signs reviewed. WD WN in NAD   Eyes: PERRL, No scleral icterus.   Pulmonary/Chest: Normal effort  Nephrostomy output brisk. Slightly bloody w/o clots  Lab Results:  Recent Labs  05/10/14 0755 05/11/14 0500  HGB 15.2 14.2  HCT 45.9 44.3   BMET  Recent Labs  05/10/14 0755  NA 140  K 4.9  CL 107  CO2 24  GLUCOSE 175*  BUN 21  CREATININE 1.16  CALCIUM 9.2    Recent Labs  05/10/14 0755  INR 1.02   No results for input(s): LABURIN in the last 72 hours. No results found for this or any previous visit.  Studies/Results: Dg C-arm Gt 120 Min-no Report  05/10/2014   CLINICAL DATA: surgery   C-ARM GT 120 MINUTE  Fluoroscopy was utilized by the requesting physician.  No radiographic  interpretation.    Ir Ureteral Stent Left New Access W/o Sep Nephrostomy Cath  05/10/2014   CLINICAL DATA:  Symptomatic left nephrolithiasis, planned percutaneous nephrolithotomy  EXAM: LEFT PERCUTANEOUS NEPHROURETERAL CATHETER PLACEMENT UNDER FLUOROSCOPIC GUIDANCE  FLUOROSCOPY TIME:  1 MINUTES 30 seconds  TECHNIQUE: The procedure, risks (including but not limited to bleeding, infection, organ damage ), benefits, and alternatives were explained to the patient. Questions regarding the procedure were encouraged and answered. The patient understands and consents to the procedure.  Laterality was marked. As antibiotic prophylaxis, cefazolin was ordered pre-procedure and administered intravenously within one hour of incision.  Intravenous Fentanyl and Versed were administered as conscious sedation during continuous cardiorespiratory monitoring by the radiology RN, with a total moderate sedation time of 7 minutes.  Left Flank region prepped with Betadine, draped in usual sterile fashion, infiltrated locally with 1% lidocaine.  Under real-time fluoroscopic guidance, a 21-gauge trocar needle was advanced into a posterior lower pole calyx using the peripheral radiodense calculus as a guide. Urine spontaneously returned through the needle. A 018 guidewire advanced easily down the ureter. Needle was exchanged over a guidewire for transitional dilator. Contrast injection confirmed appropriate positioning. Catheter was exchanged over a guidewire for a 5 [place] Kumpe catheter, advanced into the urinary bladder. Radiograph confirms appropriate nephroureteral catheter positioning. Catheter capped and secured externally. The patient tolerated the procedure well.  COMPLICATIONS: COMPLICATIONS none  IMPRESSION: 1. Technically successful left percutaneous nephroureteral catheter placement.   Electronically Signed   By: Lucrezia Europe M.D.   On: 05/10/2014 10:40    Assessment/Plan:   POD 1 left PCNL. He seems to be doing well. KUB not done yet as ordered.  Will plug neph tube.  Check KUB.  Will possibly remove neph tube later     Franchot Gallo M 05/11/2014, 7:56 AM

## 2014-05-11 NOTE — Progress Notes (Signed)
UR completed 

## 2014-05-12 MED ORDER — OXYCODONE HCL 5 MG PO TABS: 5.0000 mg | ORAL_TABLET | ORAL | Status: DC | PRN

## 2014-05-12 MED ORDER — LEVOFLOXACIN 500 MG PO TABS: 500.0000 mg | ORAL_TABLET | Freq: Every day | ORAL | Status: DC

## 2014-05-12 NOTE — Progress Notes (Signed)
Received call from Dr. Diona Fanti to plug right nephrostomy tube. Right nephrostomy tube plugged. Pt tolerated well. Will continue to monitor closely.

## 2014-05-16 ENCOUNTER — Telehealth: Payer: Self-pay | Admitting: *Deleted

## 2014-05-16 NOTE — Telephone Encounter (Signed)
Will forward to Dr. Koneswaran  

## 2014-05-16 NOTE — Telephone Encounter (Signed)
Pt was given two different samples one medication that he was not on but assumed that he was to start taking it. He started to have blood in urine. Had kidney stone surgery and followed directions to start back on blood thinners and has started bleeding again through urine. Pt would like to speak to Dr Bronson Ing himself about what is going on. He is not sure that this medication is going to work for him/tmj

## 2014-05-16 NOTE — Telephone Encounter (Signed)
Is he taking Xarelto 15 mg daily?

## 2014-05-17 NOTE — Telephone Encounter (Signed)
Yes he is still on Xarelto 15 mg . Patient states he was told by urology to stop taking Xarelto and that he may start back on Monday 05/21/14 if his urine is clear. Patient wants to know if you concur.

## 2014-05-17 NOTE — Telephone Encounter (Signed)
Patient notified. Patient voiced understanding. All questions answered.

## 2014-05-17 NOTE — Telephone Encounter (Signed)
That would be fine, understanding that being off anticoagulation increases the risk for stroke.

## 2014-05-21 NOTE — Discharge Summary (Signed)
Patient ID: [name] MRN: [id number] DOB/AGE: [birth year] 69 y.o.  Admit date: 05/10/2014 Discharge date: 05/21/2014  Primary Care Physician:  Glo Herring., MD  Discharge Diagnoses:   Large left renal stones Consults: None    Discharge Medications:   Medication List    STOP taking these medications        Rivaroxaban 15 MG Tabs tablet  Commonly known as:  XARELTO     traMADol 50 MG tablet  Commonly known as:  ULTRAM      TAKE these medications        ALPRAZolam 1 MG tablet  Commonly known as:  XANAX  Take 1 mg by mouth every 6 (six) hours as needed for anxiety.     diltiazem 180 MG 24 hr capsule  Commonly known as:  CARDIZEM CD  Take 1 capsule (180 mg total) by mouth daily.     finasteride 5 MG tablet  Commonly known as:  PROSCAR  Take 5 mg by mouth daily.     levofloxacin 500 MG tablet  Commonly known as:  LEVAQUIN  Take 1 tablet (500 mg total) by mouth daily.     lisinopril 20 MG tablet  Commonly known as:  PRINIVIL,ZESTRIL  Take 20 mg by mouth 2 (two) times daily.     metFORMIN 500 MG tablet  Commonly known as:  GLUCOPHAGE  Take 500 mg by mouth 2 (two) times daily with a meal.     metoprolol succinate 50 MG 24 hr tablet  Commonly known as:  TOPROL-XL  Take 1 tablet (50 mg total) by mouth 2 (two) times daily. Take with or immediately following a meal.     oxyCODONE 5 MG immediate release tablet  Commonly known as:  Oxy IR/ROXICODONE  Take 1 tablet (5 mg total) by mouth every 4 (four) hours as needed for moderate pain.         Significant Diagnostic Studies:  Dg Abd 1 View  05/11/2014   CLINICAL DATA:  Renal calculus.  Left-sided flank pain.  EXAM: ABDOMEN - 1 VIEW  COMPARISON:  None.  FINDINGS: The bowel gas pattern is normal. Left nephrostomy catheter is noted. Phleboliths are noted in the pelvis. Possible small left renal calculus is noted just below nephrostomy catheter.  IMPRESSION: No evidence of bowel obstruction or ileus. Left-sided  nephrostomy catheter is noted. Possible small left renal calculus seen below nephrostomy catheter.   Electronically Signed   By: Marijo Conception, M.D.   On: 05/11/2014 09:37   Dg C-arm Gt 120 Min-no Report  05/10/2014   CLINICAL DATA: surgery   C-ARM GT 120 MINUTE  Fluoroscopy was utilized by the requesting physician.  No radiographic  interpretation.    Ir Ureteral Stent Left New Access W/o Sep Nephrostomy Cath  05/10/2014   CLINICAL DATA:  Symptomatic left nephrolithiasis, planned percutaneous nephrolithotomy  EXAM: LEFT PERCUTANEOUS NEPHROURETERAL CATHETER PLACEMENT UNDER FLUOROSCOPIC GUIDANCE  FLUOROSCOPY TIME:  1 MINUTES 30 seconds  TECHNIQUE: The procedure, risks (including but not limited to bleeding, infection, organ damage ), benefits, and alternatives were explained to the patient. Questions regarding the procedure were encouraged and answered. The patient understands and consents to the procedure. Laterality was marked. As antibiotic prophylaxis, cefazolin was ordered pre-procedure and administered intravenously within one hour of incision.  Intravenous Fentanyl and Versed were administered as conscious sedation during continuous cardiorespiratory monitoring by the radiology RN, with a total moderate sedation time of 7 minutes.  Left Flank region prepped with Betadine, draped in  usual sterile fashion, infiltrated locally with 1% lidocaine.  Under real-time fluoroscopic guidance, a 21-gauge trocar needle was advanced into a posterior lower pole calyx using the peripheral radiodense calculus as a guide. Urine spontaneously returned through the needle. A 018 guidewire advanced easily down the ureter. Needle was exchanged over a guidewire for transitional dilator. Contrast injection confirmed appropriate positioning. Catheter was exchanged over a guidewire for a 5 [place] Kumpe catheter, advanced into the urinary bladder. Radiograph confirms appropriate nephroureteral catheter positioning. Catheter  capped and secured externally. The patient tolerated the procedure well.  COMPLICATIONS: COMPLICATIONS none  IMPRESSION: 1. Technically successful left percutaneous nephroureteral catheter placement.   Electronically Signed   By: Lucrezia Europe M.D.   On: 05/10/2014 10:40    Brief H and P: For complete details please refer to admission H and P, but in brief the patient was admitted for percutaneous management of large left renal calculi.  Hospital Course:  Active Problems:   Kidney stone on left side The patient was admitted to the Urology floor following left percutaneous nephrolithotomy. He had an uncomplicated postoperative course, and was discharged home POD 2 after tolerating his tube being clamp.It was then removed and a dressing placed.  Day of Discharge BP 138/93 mmHg  Pulse 86  Temp(Src) 97.9 F (36.6 C) (Oral)  Resp 20  Ht 5\' 10"  (1.778 m)  Wt 122 kg (268 lb 15.4 oz)  BMI 38.59 kg/m2  SpO2 100%  No results found for this or any previous visit (from the past 24 hour(s)).  Physical Exam: General: Alert and awake oriented x3 not in any acute distress. HEENT: anicteric sclera, pupils reactive to light and accommodation CVS: S1-S2 clear no murmur rubs or gallops Chest: clear to auscultation bilaterally, no wheezing rales or rhonchi Abdomen: soft nontender, nondistended, normal bowel sounds, no organomegaly Extremities: no cyanosis, clubbing or edema noted bilaterally Neuro: Cranial nerves II-XII intact, no focal neurological deficits  Disposition:  Home  Diet:  No restrictions  Activity:  Gradually increase   Disposition and Follow-up:     Discharge Instructions    Discharge patient    Complete by:  As directed   Give 3-4 packs of 4 by 4 s to take home            Discussed with pt/ wife  TESTS THAT NEED FOLLOW-UP  None  DISCHARGE FOLLOW-UP Follow-up Information    Follow up with Jorja Loa, MD.   Specialty:  Urology   Why:  as scheduled   Contact  information:   [place] [address] [phone]       Time spent on Discharge:  25 mins  Signed: Jorja Loa 05/21/2014, 9:11 AM

## 2014-05-29 ENCOUNTER — Ambulatory Visit (INDEPENDENT_AMBULATORY_CARE_PROVIDER_SITE_OTHER): Payer: Self-pay | Admitting: Urology

## 2014-05-29 DIAGNOSIS — N200 Calculus of kidney: Secondary | ICD-10-CM

## 2014-07-30 ENCOUNTER — Ambulatory Visit (INDEPENDENT_AMBULATORY_CARE_PROVIDER_SITE_OTHER): Payer: Medicare Other | Admitting: Cardiovascular Disease

## 2014-07-30 ENCOUNTER — Encounter: Payer: Self-pay | Admitting: Cardiovascular Disease

## 2014-07-30 VITALS — BP 122/78 | HR 86 | Ht 70.0 in | Wt 248.0 lb

## 2014-07-30 DIAGNOSIS — I429 Cardiomyopathy, unspecified: Secondary | ICD-10-CM

## 2014-07-30 DIAGNOSIS — N183 Chronic kidney disease, stage 3 unspecified: Secondary | ICD-10-CM

## 2014-07-30 DIAGNOSIS — G4730 Sleep apnea, unspecified: Secondary | ICD-10-CM

## 2014-07-30 DIAGNOSIS — I4891 Unspecified atrial fibrillation: Secondary | ICD-10-CM

## 2014-07-30 DIAGNOSIS — N200 Calculus of kidney: Secondary | ICD-10-CM | POA: Diagnosis not present

## 2014-07-30 DIAGNOSIS — I10 Essential (primary) hypertension: Secondary | ICD-10-CM | POA: Diagnosis not present

## 2014-07-30 DIAGNOSIS — E1121 Type 2 diabetes mellitus with diabetic nephropathy: Secondary | ICD-10-CM

## 2014-07-30 DIAGNOSIS — E1122 Type 2 diabetes mellitus with diabetic chronic kidney disease: Secondary | ICD-10-CM

## 2014-07-30 MED ORDER — RIVAROXABAN 20 MG PO TABS: 20.0000 mg | ORAL_TABLET | Freq: Every day | ORAL | Status: DC

## 2014-07-30 NOTE — Progress Notes (Signed)
Patient ID: [name], male   DOB: [birth year], 69 y.o.   MRN: [id number]      SUBJECTIVE: The patient presents for follow up of atrial fibrillation and cardiomyopathy. After starting anticoagulation he developed some hematuria, for which I referred him to urology as I informed him that this is usually indicative of a previously undiagnosed problem. He was subsequently found to have large left renal calculi, for which he underwent left percutaneous nephrolithotomy in 04/2014.  He currently denies palpitations, chest pain, and shortness of breath.   He is taking Xarelto 15 mg daily.    Review of Systems: As per "subjective", otherwise negative.  Allergies  Allergen Reactions  . Phenergan [Promethazine Hcl] Nausea And Vomiting  . Sulfa Antibiotics Rash    Current Outpatient Prescriptions  Medication Sig Dispense Refill  . ALPRAZolam (XANAX) 1 MG tablet Take 1 mg by mouth every 6 (six) hours as needed for anxiety.     Marland Kitchen diltiazem (CARDIZEM CD) 180 MG 24 hr capsule Take 1 capsule (180 mg total) by mouth daily. 30 capsule 6  . doxycycline (VIBRAMYCIN) 100 MG capsule Take 100 mg by mouth daily.    . finasteride (PROSCAR) 5 MG tablet Take 5 mg by mouth daily.    . fluocinonide cream (LIDEX) 6.28 % Apply 1 application topically 2 (two) times daily.    Marland Kitchen lisinopril (PRINIVIL,ZESTRIL) 20 MG tablet Take 20 mg by mouth 2 (two) times daily.     . metFORMIN (GLUCOPHAGE) 500 MG tablet Take 500 mg by mouth 2 (two) times daily with a meal.    . metoprolol succinate (TOPROL-XL) 50 MG 24 hr tablet Take 1 tablet (50 mg total) by mouth 2 (two) times daily. Take with or immediately following a meal. 180 tablet 3  . oxyCODONE (OXY IR/ROXICODONE) 5 MG immediate release tablet Take 1 tablet (5 mg total) by mouth every 4 (four) hours as needed for moderate pain. 30 tablet 0   No current facility-administered medications for this visit.    Past Medical History  Diagnosis Date  . Diabetes mellitus   .  Hypertension   . Anxiety   . Arthritis   . Polio 1946    RESIDUAL LEFT LEG / FOOT WEAKNESS  . Obesity   . Complication of anesthesia     AFTER LUNG SURGERY  . PONV (postoperative nausea and vomiting)   . Dysrhythmia 03/2014    HX A-FIB WHEN HAVING COLONOSCOPY  . Kidney stone   . Sleep apnea     USES C PAP    Past Surgical History  Procedure Laterality Date  . Lung surgery N/A 2004    "SCRAPPED A FUNGUS OFF MY LUNG"  . Vasectomy    . Colonoscopy N/A 02/01/2014    Procedure: COLONOSCOPY;  Surgeon: Rogene Houston, MD;  Location: [facility];  Service: Endoscopy;  Laterality: N/A;  1200  . Nephrolithotomy Left 05/10/2014    Procedure: NEPHROLITHOTOMY PERCUTANEOUS;  Surgeon: Jorja Loa, MD;  Location: [facility];  Service: Urology;  Laterality: Left;    History   Social History  . Marital Status: Married    Spouse Name: N/A  . Number of Children: N/A  . Years of Education: N/A   Occupational History  . Not on file.   Social History Main Topics  . Smoking status: Former Smoker -- 0.50 packs/day for 10 years    Types: Cigarettes    Start date: 03/31/1951    Quit date: 11/17/2002  . Smokeless tobacco:  Never Used  . Alcohol Use: 0.0 oz/week    0 Standard drinks or equivalent per week     Comment: social  . Drug Use: No  . Sexual Activity: Not on file   Other Topics Concern  . Not on file   Social History Narrative     Filed Vitals:   07/30/14 1559  BP: 122/78  Pulse: 86  Height: 5\' 10"  (1.778 m)  Weight: 248 lb (112.492 kg)    PHYSICAL EXAM General: NAD  Neck: No JVD, no thyromegaly or thyroid nodule.  Lungs: Clear to auscultation bilaterally with normal respiratory effort.  CV: Nondisplaced PMI. Irregular rhythm, normal S1/S2, no S3, no murmur. Mild left ankle and dorsal foot edema (chronic). No carotid bruit.  Abdomen: Soft, nontender, obese, no distention.  Skin: Intact without lesions or rashes.  Neurologic: Alert and oriented x 3.  Psych:  Normal affect.  Extremities: No clubbing or cyanosis.  HEENT: Normal.    ECG: Most recent ECG reviewed.      ASSESSMENT AND PLAN: 1. Cardiomyopathy, EF 40-45%: He has no history of MI. Given the diffuse hypokinesis, this is likely to be nonischemic in etiology. 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 I will continue metoprolol succinate 50 mg twice daily. For the time being, I will also continue long-acting diltiazem 180 mg daily but would consider reducing this dose in the future. I may consider nuclear stress testing in the future.   2. Atrial fibrillation: HR is controlled on long-acting metoprolol and diltiazem. He is tolerating Xarelto 15 mg daily with no hematuria in several months. Will increase to 20 mg daily for adequate thromboembolic risk reduction.   3. Sleep apnea: Uses CPAP daily. Likely etiology of right-sided chamber enlargement.   4. Type 2 diabetes: Mildly uncontrolled with HbA1C 7.2%. Continue metformin 500 mg bid.   5. Essential HTN: Well controlled. No changes.   6. CKD stage 3: GFR 62 ml/min on 05/10/14. No changes to therapy. 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   Dispo: f/u 6 months.    Kate Sable, M.D., F.A.C.C.

## 2014-07-30 NOTE — Patient Instructions (Addendum)
Your physician wants you to follow-up in: 6 months with Dr. Bronson Ing. You will receive a reminder letter in the mail two months in advance. If you don't receive a letter, please call our office to schedule the follow-up appointment.  Your physician has recommended you make the following change in your medication:    Increase Xarelto to 20 mg Daily.   Thank you for choosing [facility]!

## 2014-08-28 ENCOUNTER — Ambulatory Visit (HOSPITAL_COMMUNITY)
Admission: RE | Admit: 2014-08-28 | Discharge: 2014-08-28 | Disposition: A | Discharge status: Home / Self Care | Payer: Medicare Other | Source: Ambulatory Visit | Attending: Urology | Admitting: Urology

## 2014-08-28 ENCOUNTER — Other Ambulatory Visit: Payer: Self-pay | Admitting: Urology

## 2014-08-28 DIAGNOSIS — Z87442 Personal history of urinary calculi: Secondary | ICD-10-CM | POA: Insufficient documentation

## 2014-08-28 DIAGNOSIS — R109 Unspecified abdominal pain: Secondary | ICD-10-CM | POA: Insufficient documentation

## 2014-08-28 DIAGNOSIS — N200 Calculus of kidney: Secondary | ICD-10-CM

## 2014-09-04 ENCOUNTER — Ambulatory Visit (INDEPENDENT_AMBULATORY_CARE_PROVIDER_SITE_OTHER): Payer: Medicare Other | Admitting: Urology

## 2014-09-04 DIAGNOSIS — N200 Calculus of kidney: Secondary | ICD-10-CM

## 2014-10-26 ENCOUNTER — Other Ambulatory Visit: Payer: Self-pay | Admitting: Cardiovascular Disease

## 2014-11-12 ENCOUNTER — Other Ambulatory Visit: Payer: Self-pay | Admitting: Cardiovascular Disease

## 2014-11-15 ENCOUNTER — Telehealth: Payer: Self-pay | Admitting: Cardiovascular Disease

## 2014-11-15 NOTE — Telephone Encounter (Signed)
48 hrs beforehand.

## 2014-11-15 NOTE — Telephone Encounter (Signed)
Pt is going to the dermatologist and would like to know if he needs to go off his Xarelto

## 2014-11-15 NOTE — Telephone Encounter (Signed)
Patient states he has a small place on his face to be removed. And wants to check before making the appt. To see how long he needs to come off his Xarelto. Please advise.

## 2014-11-15 NOTE — Telephone Encounter (Signed)
Left message on PT personal voicemail.

## 2015-02-04 ENCOUNTER — Telehealth: Payer: Self-pay | Admitting: Cardiovascular Disease

## 2015-02-04 NOTE — Telephone Encounter (Signed)
Patient calling the office for samples of medication: ° ° °1.  What medication and dosage are you requesting samples for? Xarelto 20 mg ° °2.  Are you currently out of this medication? No ° ° °

## 2015-02-04 NOTE — Telephone Encounter (Signed)
Samples available,wife to pick up.see sign out book

## 2015-02-06 ENCOUNTER — Other Ambulatory Visit: Payer: Self-pay | Admitting: Cardiovascular Disease

## 2015-02-20 ENCOUNTER — Ambulatory Visit (INDEPENDENT_AMBULATORY_CARE_PROVIDER_SITE_OTHER): Payer: Medicare Other | Admitting: Cardiovascular Disease

## 2015-02-20 ENCOUNTER — Encounter: Payer: Self-pay | Admitting: Cardiovascular Disease

## 2015-02-20 VITALS — BP 124/72 | HR 82 | Ht 70.0 in | Wt 268.5 lb

## 2015-02-20 DIAGNOSIS — Z23 Encounter for immunization: Secondary | ICD-10-CM

## 2015-02-20 DIAGNOSIS — I4891 Unspecified atrial fibrillation: Secondary | ICD-10-CM | POA: Diagnosis not present

## 2015-02-20 DIAGNOSIS — G4730 Sleep apnea, unspecified: Secondary | ICD-10-CM

## 2015-02-20 DIAGNOSIS — N183 Chronic kidney disease, stage 3 (moderate): Secondary | ICD-10-CM

## 2015-02-20 DIAGNOSIS — I10 Essential (primary) hypertension: Secondary | ICD-10-CM | POA: Diagnosis not present

## 2015-02-20 DIAGNOSIS — E1121 Type 2 diabetes mellitus with diabetic nephropathy: Secondary | ICD-10-CM

## 2015-02-20 DIAGNOSIS — E1122 Type 2 diabetes mellitus with diabetic chronic kidney disease: Secondary | ICD-10-CM

## 2015-02-20 DIAGNOSIS — I429 Cardiomyopathy, unspecified: Secondary | ICD-10-CM | POA: Diagnosis not present

## 2015-02-20 NOTE — Progress Notes (Signed)
Patient ID: [name], male   DOB: May 08, 1945, 69 y.o.   MRN: [id number]      SUBJECTIVE: The patient presents for follow up of atrial fibrillation and cardiomyopathy. After starting anticoagulation he developed some hematuria, for which I referred him to urology as I informed him that this is usually indicative of a previously undiagnosed problem. He was subsequently found to have large left renal calculi, for which he underwent left percutaneous nephrolithotomy in 04/2014.  He currently denies palpitations, chest pain, hematuria, and shortness of breath.   He is taking Xarelto 20 mg daily.    Review of Systems: As per "subjective", otherwise negative.  Allergies  Allergen Reactions  . Sulfa Antibiotics Rash    Current Outpatient Prescriptions  Medication Sig Dispense Refill  . ALPRAZolam (XANAX) 1 MG tablet Take 1 mg by mouth every 6 (six) hours as needed for anxiety.     Marland Kitchen diltiazem (CARDIZEM CD) 180 MG 24 hr capsule TAKE ONE CAPSULE BY MOUTH DAILY. 30 capsule 5  . doxycycline (VIBRAMYCIN) 100 MG capsule Take 100 mg by mouth daily.    . finasteride (PROSCAR) 5 MG tablet Take 5 mg by mouth daily.    . fluocinonide cream (LIDEX) [id number] % Apply 1 application topically 2 (two) times daily.    Marland Kitchen lisinopril (PRINIVIL,ZESTRIL) 20 MG tablet Take 20 mg by mouth 2 (two) times daily.     . metFORMIN (GLUCOPHAGE) 500 MG tablet Take 500 mg by mouth 2 (two) times daily with a meal.    . metoprolol succinate (TOPROL-XL) 50 MG 24 hr tablet TAKE ONE TABLET BY MOUTH TWICE DAILY WITH FOOD OR IMMEDIATELY FOLLOWING A MEAL. 180 tablet 3  . oxyCODONE (OXY IR/ROXICODONE) 5 MG immediate release tablet Take 1 tablet (5 mg total) by mouth every 4 (four) hours as needed for moderate pain. 30 tablet 0  . rivaroxaban (XARELTO) 20 MG TABS tablet Take 1 tablet (20 mg total) by mouth daily with supper. 30 tablet 6   No current facility-administered medications for this visit.    Past Medical History    Diagnosis Date  . Diabetes mellitus ([place])   . Hypertension   . Anxiety   . Arthritis   . Polio 1946    RESIDUAL LEFT LEG / FOOT WEAKNESS  . Obesity   . Complication of anesthesia     AFTER LUNG SURGERY  . PONV (postoperative nausea and vomiting)   . Dysrhythmia 03/2014    HX A-FIB WHEN HAVING COLONOSCOPY  . Kidney stone   . Sleep apnea     USES C PAP    Past Surgical History  Procedure Laterality Date  . Lung surgery N/A 2004    "SCRAPPED A FUNGUS OFF MY LUNG"  . Vasectomy    . Colonoscopy N/A 02/01/2014    Procedure: COLONOSCOPY;  Surgeon: Rogene Houston, MD;  [facility];  Service: Endoscopy;  Laterality: N/A;  1200  . Nephrolithotomy Left 05/10/2014    Procedure: NEPHROLITHOTOMY PERCUTANEOUS;  Surgeon: Jorja Loa, MD;  Location: [facility];  Service: Urology;  Laterality: Left;    Social History   Social History  . Marital Status: Married    Spouse Name: N/A  . Number of Children: N/A  . Years of Education: N/A   Occupational History  . Not on file.   Social History Main Topics  . Smoking status: Former Smoker -- 0.50 packs/day for 10 years    Types: Cigarettes    Start date: 03/31/1951  Quit date: 11/17/2002  . Smokeless tobacco: Never Used  . Alcohol Use: 0.0 oz/week    0 Standard drinks or equivalent per week     Comment: social  . Drug Use: No  . Sexual Activity: Not on file   Other Topics Concern  . Not on file   Social History Narrative     Filed Vitals:   02/20/15 1356  BP: 124/72  Pulse: 82  Height: 5\' 10"  (1.778 m)  Weight: 268 lb 8 oz (121.791 kg)  SpO2: 99%    PHYSICAL EXAM General: NAD  Neck: No JVD, no thyromegaly or thyroid nodule.  Lungs: Clear to auscultation bilaterally with normal respiratory effort.  CV: Nondisplaced PMI. Irregular rhythm, normal S1/S2, no S3, no murmur. Mild left ankle and dorsal foot edema (chronic). Abdomen: Soft, nontender, obese, no distention.  Skin: Intact without lesions or  rashes.  Neurologic: Alert and oriented x 3.  Psych: Normal affect.  Extremities: No clubbing or cyanosis.  HEENT: Normal.   ECG: Most recent ECG reviewed.      ASSESSMENT AND PLAN: 1. Cardiomyopathy, EF 40-45%: He has no history of MI. Given the diffuse hypokinesis, this is likely to be nonischemic in etiology. 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 I will continue metoprolol succinate 50 mg twice daily. For the time being, I will also continue long-acting diltiazem 180 mg daily but would consider reducing this dose in the future. I may consider nuclear stress testing in the future.   2. Atrial fibrillation: HR is controlled on long-acting metoprolol and diltiazem. Continue Xarelto 20 mg daily (over $300 per month) for thromboembolic risk reduction. If there is a less costly alternative, he will let me know.  3. Sleep apnea: Uses CPAP daily. Likely etiology of right-sided chamber enlargement.   4. Type 2 diabetes: Mildly uncontrolled with HbA1C 7.2%. Continue metformin 500 mg bid.   5. Essential HTN: Well controlled. No changes.   6. CKD stage 3: GFR 62 ml/min on 05/10/14. No changes to therapy. 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   Dispo: f/u 1 year.  Kate Sable, M.D., F.A.C.C.

## 2015-02-20 NOTE — Patient Instructions (Signed)
Your physician wants you to follow-up in:  1 year with Dr Koneswaran You will receive a reminder letter in the mail two months in advance. If you don't receive a letter, please call our office to schedule the follow-up appointment.    Your physician recommends that you continue on your current medications as directed. Please refer to the Current Medication list given to you today.    If you need a refill on your cardiac medications before your next appointment, please call your pharmacy.     Thank you for choosing [facility] !        

## 2015-06-22 ENCOUNTER — Other Ambulatory Visit: Payer: Self-pay | Admitting: Cardiovascular Disease

## 2015-08-13 ENCOUNTER — Telehealth: Payer: Self-pay | Admitting: Cardiovascular Disease

## 2015-08-13 NOTE — Telephone Encounter (Signed)
Spoke with pt,messaged relayed

## 2015-08-13 NOTE — Telephone Encounter (Signed)
If he is having skin lesions removed, there will be increased bleeding (not significant). That being said, I would not favor stopping Xarelto.

## 2015-08-13 NOTE — Telephone Encounter (Signed)
Forwarded to Dr.Koneswaran 

## 2015-08-13 NOTE — Telephone Encounter (Signed)
[id number] 

## 2015-08-13 NOTE — Telephone Encounter (Signed)
Patient wants to know if it is okay for him to take Xarelto prior to visit to Dermatologist / tg

## 2015-09-03 ENCOUNTER — Other Ambulatory Visit: Payer: Self-pay | Admitting: Cardiovascular Disease

## 2015-10-29 ENCOUNTER — Other Ambulatory Visit: Payer: Self-pay | Admitting: Urology

## 2015-10-29 DIAGNOSIS — N200 Calculus of kidney: Secondary | ICD-10-CM

## 2015-11-18 ENCOUNTER — Ambulatory Visit (HOSPITAL_COMMUNITY)
Admission: RE | Admit: 2015-11-18 | Discharge: 2015-11-18 | Disposition: A | Discharge status: Home / Self Care | Payer: Medicare Other | Source: Ambulatory Visit | Attending: Urology | Admitting: Urology

## 2015-11-18 ENCOUNTER — Other Ambulatory Visit: Payer: Self-pay | Admitting: Cardiology

## 2015-11-18 DIAGNOSIS — N200 Calculus of kidney: Secondary | ICD-10-CM | POA: Insufficient documentation

## 2015-12-03 ENCOUNTER — Ambulatory Visit (INDEPENDENT_AMBULATORY_CARE_PROVIDER_SITE_OTHER): Payer: Medicare Other | Admitting: Urology

## 2015-12-03 DIAGNOSIS — N200 Calculus of kidney: Secondary | ICD-10-CM

## 2015-12-08 ENCOUNTER — Telehealth: Payer: Self-pay | Admitting: Cardiology

## 2015-12-08 ENCOUNTER — Other Ambulatory Visit: Payer: Self-pay | Admitting: Cardiology

## 2015-12-08 MED ORDER — DILTIAZEM HCL ER COATED BEADS 180 MG PO CP24: 180.0000 mg | ORAL_CAPSULE | Freq: Every day | ORAL | 3 refills | Status: DC

## 2015-12-08 NOTE — Telephone Encounter (Signed)
Needed refill of Diltiazem, e-prescribed.   Kerin Ransom PA-C 12/08/2015 12:59 PM

## 2015-12-25 ENCOUNTER — Telehealth: Payer: Self-pay | Admitting: Cardiovascular Disease

## 2015-12-25 NOTE — Telephone Encounter (Signed)
Pt called and complained that his xarelto went from  40$ per month to 185$ per month due to being in the " donut hole" want to know if there  Is another medication he can take for a few months until he gets out of the " donut hole?" Please advise

## 2015-12-25 NOTE — Telephone Encounter (Signed)
Patient wants to speak with nurse regarding his Xarelto. / tg

## 2015-12-25 NOTE — Telephone Encounter (Signed)
Pt given 4 sample bottles of Xarelto

## 2015-12-25 NOTE — Telephone Encounter (Signed)
Would speak with rep first to see if company can help.

## 2016-03-09 ENCOUNTER — Ambulatory Visit (INDEPENDENT_AMBULATORY_CARE_PROVIDER_SITE_OTHER): Payer: Medicare Other | Admitting: Cardiovascular Disease

## 2016-03-09 ENCOUNTER — Encounter: Payer: Self-pay | Admitting: Cardiovascular Disease

## 2016-03-09 VITALS — BP 122/82 | HR 92 | Ht 70.0 in | Wt 271.0 lb

## 2016-03-09 DIAGNOSIS — I482 Chronic atrial fibrillation, unspecified: Secondary | ICD-10-CM

## 2016-03-09 DIAGNOSIS — I10 Essential (primary) hypertension: Secondary | ICD-10-CM

## 2016-03-09 DIAGNOSIS — G4733 Obstructive sleep apnea (adult) (pediatric): Secondary | ICD-10-CM | POA: Diagnosis not present

## 2016-03-09 DIAGNOSIS — I429 Cardiomyopathy, unspecified: Secondary | ICD-10-CM

## 2016-03-09 MED ORDER — RIVAROXABAN 20 MG PO TABS: ORAL_TABLET | ORAL | 4 refills | Status: DC

## 2016-03-09 NOTE — Patient Instructions (Signed)
Your physician wants you to follow-up in: 1 year Dr Koneswaran You will receive a reminder letter in the mail two months in advance. If you don't receive a letter, please call our office to schedule the follow-up appointment.     Your physician recommends that you continue on your current medications as directed. Please refer to the Current Medication list given to you today.     Thank you for choosing [facility] !        

## 2016-03-09 NOTE — Progress Notes (Signed)
SUBJECTIVE: The patient presents for follow up of atrial fibrillation and cardiomyopathy. After starting anticoagulation he developed some hematuria, for which I referred him to urology as I informed him that this is usually indicative of a previously undiagnosed problem. He was subsequently found to have large left renal calculi, for which he underwent left percutaneous nephrolithotomy in 04/2014.  He currently denies palpitations, chest pain, hematuria, and shortness of breath. Walks with a cane due to polio.  He is taking Xarelto 20 mg daily.   ECG today shows rate controlled atrial fibrillation.   Review of Systems: As per "subjective", otherwise negative.  Allergies  Allergen Reactions  . Sulfa Antibiotics Rash    Current Outpatient Prescriptions  Medication Sig Dispense Refill  . ALPRAZolam (XANAX) 1 MG tablet Take 1 mg by mouth every 6 (six) hours as needed for anxiety.     Marland Kitchen diltiazem (CARDIZEM CD) 180 MG 24 hr capsule Take 1 capsule (180 mg total) by mouth daily. 90 capsule 3  . doxycycline (VIBRAMYCIN) 100 MG capsule Take 100 mg by mouth daily.    . finasteride (PROSCAR) 5 MG tablet Take 5 mg by mouth daily.    . fluocinonide cream (LIDEX) [id number] % Apply 1 application topically 2 (two) times daily.    Marland Kitchen lisinopril (PRINIVIL,ZESTRIL) 20 MG tablet Take 20 mg by mouth 2 (two) times daily.     . metFORMIN (GLUCOPHAGE) 500 MG tablet Take 500 mg by mouth 2 (two) times daily with a meal.    . metoprolol succinate (TOPROL-XL) 50 MG 24 hr tablet TAKE ONE TABLET BY MOUTH TWICE DAILY WITH FOOD OR IMMEDIATELY FOLLOWING A MEAL. 180 tablet 3  . oxyCODONE (OXY IR/ROXICODONE) 5 MG immediate release tablet Take 1 tablet (5 mg total) by mouth every 4 (four) hours as needed for moderate pain. 30 tablet 0  . XARELTO 20 MG TABS tablet TAKE 1 TABLET ONCE DAILY WITH SUPPER. 30 tablet 6   No current facility-administered medications for this visit.     Past Medical History:  Diagnosis  Date  . Anxiety   . Arthritis   . Complication of anesthesia    AFTER LUNG SURGERY  . Diabetes mellitus ([place])   . Dysrhythmia 03/2014   HX A-FIB WHEN HAVING COLONOSCOPY  . Hypertension   . Kidney stone   . Obesity   . Polio 1946   RESIDUAL LEFT LEG / FOOT WEAKNESS  . PONV (postoperative nausea and vomiting)   . Sleep apnea    USES C PAP    Past Surgical History:  Procedure Laterality Date  . COLONOSCOPY N/A 02/01/2014   Procedure: COLONOSCOPY;  Surgeon: Rogene Houston, MD;  [facility];  Service: Endoscopy;  Laterality: N/A;  1200  . LUNG SURGERY N/A 2004   "SCRAPPED A FUNGUS OFF MY LUNG"  . NEPHROLITHOTOMY Left 05/10/2014   Procedure: NEPHROLITHOTOMY PERCUTANEOUS;  Surgeon: Jorja Loa, MD;  Location: [facility];  Service: Urology;  Laterality: Left;  Marland Kitchen VASECTOMY      Social History   Social History  . Marital status: Married    Spouse name: N/A  . Number of children: N/A  . Years of education: N/A   Occupational History  . Not on file.   Social History Main Topics  . Smoking status: Former Smoker    Packs/day: 0.50    Years: 10.00    Types: Cigarettes    Start date: 03/31/1951    Quit date: 11/17/2002  .  Smokeless tobacco: Never Used  . Alcohol use 0.0 oz/week     Comment: social  . Drug use: No  . Sexual activity: Not on file   Other Topics Concern  . Not on file   Social History Narrative  . No narrative on file     Vitals:   03/09/16 1354  BP: 122/82  Pulse: 92  SpO2: 94%  Weight: 271 lb (122.9 kg)  Height: 5\' 10"  (1.778 m)    PHYSICAL EXAM General: NAD  Neck: No JVD, no thyromegaly or thyroid nodule.  Lungs: Clear to auscultation bilaterally with normal respiratory effort.  CV: Nondisplaced PMI. Irregular rhythm, normal S1/S2, no S3, no murmur. Mild left ankle and dorsal foot edema (chronic). Abdomen: Soft, nontender, obese, no distention.  Skin: Intact without lesions or rashes.  Neurologic: Alert and oriented x 3.   Psych: Normal affect.  Extremities: No clubbing or cyanosis.  HEENT: Rhinophyma.    ECG: Most recent ECG reviewed.      ASSESSMENT AND PLAN:  1. Cardiomyopathy, EF 40-45% (02/02/14): He has no history of MI. Given the diffuse hypokinesis, this is likely to be nonischemic in etiology. 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 I will continue metoprolol succinate 50 mg twice daily. For the time being, I will also continue long-acting diltiazem 180 mg daily but would consider reducing this dose in the future. I may consider nuclear stress testing in the future.   2. Atrial fibrillation: HR is controlled on long-acting metoprolol and diltiazem. Continue Xarelto 20 mg daily for thromboembolic risk reduction.   3. Sleep apnea: Uses CPAP daily. Likely etiology of right-sided chamber enlargement.   4. Essential HTN: Well controlled. No changes.   Dispo: fu 1 yr  Kate Sable, M.D., F.A.C.C.

## 2016-03-10 ENCOUNTER — Other Ambulatory Visit: Payer: Self-pay | Admitting: Cardiovascular Disease

## 2016-03-10 NOTE — Telephone Encounter (Signed)
Wife wants to wait on refill with diltiazem since pt has 26 pills left and a paper script for new insurance company they will start in Jan 2018

## 2016-03-10 NOTE — Telephone Encounter (Signed)
Please call patient regarding refills on Diltiazem. /t g

## 2016-04-07 ENCOUNTER — Telehealth: Payer: Self-pay | Admitting: Cardiovascular Disease

## 2016-04-07 NOTE — Telephone Encounter (Signed)
Please give pt a call about possibly having to stop Xarelto for a dermatologists apt

## 2016-04-07 NOTE — Telephone Encounter (Signed)
Pt going to derm in 2 days for skin check on face for rosea , he doesn't think they will do any cauterizing or freezing and states he doesn't plan to stop xarelto

## 2016-04-09 DIAGNOSIS — L718 Other rosacea: Secondary | ICD-10-CM | POA: Diagnosis not present

## 2016-09-25 ENCOUNTER — Other Ambulatory Visit: Payer: Self-pay | Admitting: *Deleted

## 2016-09-25 MED ORDER — RIVAROXABAN 20 MG PO TABS: ORAL_TABLET | ORAL | 0 refills | Status: DC

## 2016-10-13 DIAGNOSIS — Z1389 Encounter for screening for other disorder: Secondary | ICD-10-CM | POA: Diagnosis not present

## 2016-10-13 DIAGNOSIS — I10 Essential (primary) hypertension: Secondary | ICD-10-CM | POA: Diagnosis not present

## 2016-10-13 DIAGNOSIS — E1129 Type 2 diabetes mellitus with other diabetic kidney complication: Secondary | ICD-10-CM | POA: Diagnosis not present

## 2016-10-13 DIAGNOSIS — Z6839 Body mass index (BMI) 39.0-39.9, adult: Secondary | ICD-10-CM | POA: Diagnosis not present

## 2016-10-13 DIAGNOSIS — E119 Type 2 diabetes mellitus without complications: Secondary | ICD-10-CM | POA: Diagnosis not present

## 2016-10-13 DIAGNOSIS — E669 Obesity, unspecified: Secondary | ICD-10-CM | POA: Diagnosis not present

## 2016-10-13 DIAGNOSIS — F419 Anxiety disorder, unspecified: Secondary | ICD-10-CM | POA: Diagnosis not present

## 2016-10-20 DIAGNOSIS — I4891 Unspecified atrial fibrillation: Secondary | ICD-10-CM | POA: Diagnosis not present

## 2016-11-12 ENCOUNTER — Other Ambulatory Visit: Payer: Self-pay | Admitting: Cardiovascular Disease

## 2016-11-18 DIAGNOSIS — I4891 Unspecified atrial fibrillation: Secondary | ICD-10-CM | POA: Diagnosis not present

## 2016-12-01 ENCOUNTER — Ambulatory Visit: Payer: Medicare Other | Admitting: Urology

## 2016-12-22 DIAGNOSIS — I4891 Unspecified atrial fibrillation: Secondary | ICD-10-CM | POA: Diagnosis not present

## 2016-12-31 ENCOUNTER — Other Ambulatory Visit: Payer: Self-pay | Admitting: Cardiology

## 2017-01-01 NOTE — Telephone Encounter (Signed)
This is Dr. Koneswaran's pt. °

## 2017-01-11 ENCOUNTER — Ambulatory Visit (HOSPITAL_COMMUNITY)
Admission: RE | Admit: 2017-01-11 | Discharge: 2017-01-11 | Disposition: A | Discharge status: Home / Self Care | Payer: PPO | Source: Ambulatory Visit | Attending: Urology | Admitting: Urology

## 2017-01-11 ENCOUNTER — Other Ambulatory Visit: Payer: Self-pay | Admitting: Urology

## 2017-01-11 DIAGNOSIS — N200 Calculus of kidney: Secondary | ICD-10-CM | POA: Insufficient documentation

## 2017-01-26 ENCOUNTER — Ambulatory Visit (INDEPENDENT_AMBULATORY_CARE_PROVIDER_SITE_OTHER): Payer: PPO | Admitting: Urology

## 2017-01-26 DIAGNOSIS — N200 Calculus of kidney: Secondary | ICD-10-CM

## 2017-02-02 DIAGNOSIS — I4891 Unspecified atrial fibrillation: Secondary | ICD-10-CM | POA: Diagnosis not present

## 2017-02-02 DIAGNOSIS — E782 Mixed hyperlipidemia: Secondary | ICD-10-CM | POA: Diagnosis not present

## 2017-02-02 DIAGNOSIS — Z0000 Encounter for general adult medical examination without abnormal findings: Secondary | ICD-10-CM | POA: Diagnosis not present

## 2017-02-02 DIAGNOSIS — E1165 Type 2 diabetes mellitus with hyperglycemia: Secondary | ICD-10-CM | POA: Diagnosis not present

## 2017-02-02 DIAGNOSIS — Z6837 Body mass index (BMI) 37.0-37.9, adult: Secondary | ICD-10-CM | POA: Diagnosis not present

## 2017-02-02 DIAGNOSIS — I10 Essential (primary) hypertension: Secondary | ICD-10-CM | POA: Diagnosis not present

## 2017-02-02 DIAGNOSIS — F419 Anxiety disorder, unspecified: Secondary | ICD-10-CM | POA: Diagnosis not present

## 2017-02-02 DIAGNOSIS — Z1389 Encounter for screening for other disorder: Secondary | ICD-10-CM | POA: Diagnosis not present

## 2017-02-08 DIAGNOSIS — I4891 Unspecified atrial fibrillation: Secondary | ICD-10-CM | POA: Diagnosis not present

## 2017-02-16 DIAGNOSIS — G4733 Obstructive sleep apnea (adult) (pediatric): Secondary | ICD-10-CM | POA: Diagnosis not present

## 2017-03-29 DIAGNOSIS — I4891 Unspecified atrial fibrillation: Secondary | ICD-10-CM | POA: Diagnosis not present

## 2017-03-31 ENCOUNTER — Other Ambulatory Visit: Payer: Self-pay | Admitting: Cardiology

## 2017-03-31 NOTE — Telephone Encounter (Signed)
Rx has been sent to the pharmacy electronically. ° °

## 2017-04-02 ENCOUNTER — Ambulatory Visit: Payer: PPO | Admitting: Cardiovascular Disease

## 2017-04-13 ENCOUNTER — Telehealth (INDEPENDENT_AMBULATORY_CARE_PROVIDER_SITE_OTHER): Payer: Self-pay | Admitting: *Deleted

## 2017-04-13 NOTE — Telephone Encounter (Signed)
TCS in 01/2014 had hyperplastic polyp and TCS in 11/2006 had tubular adenoma (scanned from [facility])   when is patient due for next TCS

## 2017-04-13 NOTE — Telephone Encounter (Signed)
Unless having problems he can wait 7 years from his last exam.

## 2017-04-13 NOTE — Telephone Encounter (Signed)
Noted in recall 

## 2017-04-28 DIAGNOSIS — I4891 Unspecified atrial fibrillation: Secondary | ICD-10-CM | POA: Diagnosis not present

## 2017-05-03 ENCOUNTER — Encounter: Payer: Self-pay | Admitting: Cardiovascular Disease

## 2017-05-03 ENCOUNTER — Ambulatory Visit (INDEPENDENT_AMBULATORY_CARE_PROVIDER_SITE_OTHER): Payer: PPO | Admitting: Cardiovascular Disease

## 2017-05-03 VITALS — BP 120/80 | HR 90 | Ht 70.0 in | Wt 262.0 lb

## 2017-05-03 DIAGNOSIS — Z9989 Dependence on other enabling machines and devices: Secondary | ICD-10-CM

## 2017-05-03 DIAGNOSIS — I482 Chronic atrial fibrillation: Secondary | ICD-10-CM

## 2017-05-03 DIAGNOSIS — I429 Cardiomyopathy, unspecified: Secondary | ICD-10-CM | POA: Diagnosis not present

## 2017-05-03 DIAGNOSIS — I10 Essential (primary) hypertension: Secondary | ICD-10-CM

## 2017-05-03 DIAGNOSIS — G4733 Obstructive sleep apnea (adult) (pediatric): Secondary | ICD-10-CM | POA: Diagnosis not present

## 2017-05-03 DIAGNOSIS — I4821 Permanent atrial fibrillation: Secondary | ICD-10-CM

## 2017-05-03 NOTE — Patient Instructions (Signed)
Your physician wants you to follow-up in: 1 year with Dr.Koneswaran You will receive a reminder letter in the mail two months in advance. If you don't receive a letter, please call our office to schedule the follow-up appointment.   Your physician has requested that you have an echocardiogram. Echocardiography is a painless test that uses sound waves to create images of your heart. It provides your doctor with information about the size and shape of your heart and how well your heart's chambers and valves are working. This procedure takes approximately one hour. There are no restrictions for this procedure.     Your physician recommends that you continue on your current medications as directed. Please refer to the Current Medication list given to you today.     If you need a refill on your cardiac medications before your next appointment, please call your pharmacy.       No lab work ordered today.          Thank you for choosing [facility] !        

## 2017-05-03 NOTE — Progress Notes (Signed)
SUBJECTIVE: The patient presents for annual follow-up of atrial fibrillation and cardiomyopathy.  He had been on Xarelto but is now anticoagulated with warfarin.  He has a history of hematuria due to renal calculi for which he underwent percutaneous nephrolithotomy in February 2016.  ECG performed today which I personally reviewed demonstrates rate controlled atrial fibrillation.  The patient denies any symptoms of chest pain, palpitations, shortness of breath, lightheadedness, dizziness, leg swelling, orthopnea, PND, and syncope.  He walks with a cane due to a history of polio.  He told me that his wife has COPD and had an MI and had a stent.  She has some difficulty breathing.     Review of Systems: As per "subjective", otherwise negative.  Allergies  Allergen Reactions  . Sulfa Antibiotics Rash    Current Outpatient Medications  Medication Sig Dispense Refill  . ALPRAZolam (XANAX) 1 MG tablet Take 1 mg by mouth every 6 (six) hours as needed for anxiety.     Marland Kitchen diltiazem (CARDIZEM CD) 180 MG 24 hr capsule TAKE ONE CAPSULE BY MOUTH DAILY. 90 capsule 0  . finasteride (PROSCAR) 5 MG tablet Take 5 mg by mouth daily.    Marland Kitchen lisinopril (PRINIVIL,ZESTRIL) 20 MG tablet Take 20 mg by mouth 2 (two) times daily.     . metFORMIN (GLUCOPHAGE) 500 MG tablet Take 500 mg by mouth 2 (two) times daily with a meal.    . metoprolol succinate (TOPROL-XL) 50 MG 24 hr tablet TAKE ONE TABLET BY MOUTH TWICE DAILY WITH FOOD OR IMMEDIATELY FOLLOWING A MEAL. 180 tablet 3  . warfarin (COUMADIN) 5 MG tablet Take 5 mg by mouth one time only at 6 PM.      No current facility-administered medications for this visit.     Past Medical History:  Diagnosis Date  . Anxiety   . Arthritis   . Complication of anesthesia    AFTER LUNG SURGERY  . Diabetes mellitus ([place])   . Dysrhythmia 03/2014   HX A-FIB WHEN HAVING COLONOSCOPY  . Hypertension   . Kidney stone   . Obesity   . Polio 1946   RESIDUAL LEFT LEG  / FOOT WEAKNESS  . PONV (postoperative nausea and vomiting)   . Sleep apnea    USES C PAP    Past Surgical History:  Procedure Laterality Date  . COLONOSCOPY N/A 02/01/2014   Procedure: COLONOSCOPY;  Surgeon: Rogene Houston, MD;  [facility];  Service: Endoscopy;  Laterality: N/A;  1200  . LUNG SURGERY N/A 2004   "SCRAPPED A FUNGUS OFF MY LUNG"  . NEPHROLITHOTOMY Left 05/10/2014   Procedure: NEPHROLITHOTOMY PERCUTANEOUS;  Surgeon: Jorja Loa, MD;  Location: [facility];  Service: Urology;  Laterality: Left;  Marland Kitchen VASECTOMY      Social History   Socioeconomic History  . Marital status: Married    Spouse name: Not on file  . Number of children: Not on file  . Years of education: Not on file  . Highest education level: Not on file  Social Needs  . Financial resource strain: Not on file  . Food insecurity - worry: Not on file  . Food insecurity - inability: Not on file  . Transportation needs - medical: Not on file  . Transportation needs - non-medical: Not on file  Occupational History  . Not on file  Tobacco Use  . Smoking status: Former Smoker    Packs/day: 0.50    Years: 10.00  Pack years: 5.00    Types: Cigarettes    Start date: 03/31/1951    Last attempt to quit: 11/17/2002    Years since quitting: 14.4  . Smokeless tobacco: Never Used  Substance and Sexual Activity  . Alcohol use: Yes    Alcohol/week: 0.0 oz    Comment: social  . Drug use: No  . Sexual activity: Not on file  Other Topics Concern  . Not on file  Social History Narrative  . Not on file     Vitals:   05/03/17 1256  BP: 120/80  Pulse: 90  SpO2: 98%  Weight: 262 lb (118.8 kg)  Height: 5\' 10"  (1.778 m)    Wt Readings from Last 3 Encounters:  05/03/17 262 lb (118.8 kg)  03/09/16 271 lb (122.9 kg)  02/20/15 268 lb 8 oz (121.8 kg)     PHYSICAL EXAM General: NAD HEENT: Rhinophyma noted. Neck: No JVD, no thyromegaly. Lungs: Clear to auscultation bilaterally with normal  respiratory effort. CV: Regular rate and irregular rhythm, normal S1/S2, no S3, no murmur. No pretibial edema.  No carotid bruit.   Abdomen: Soft, nontender, no distention.  Neurologic: Alert and oriented.  Psych: Normal affect. Skin: Rosacea of nose (rhinophyma). Musculoskeletal: No gross deformities.    ECG: Most recent ECG reviewed.   Labs: Lab Results  Component Value Date/Time   K 4.9 05/10/2014 07:55 AM   BUN 21 05/10/2014 07:55 AM   CREATININE 1.16 05/10/2014 07:55 AM   CREATININE 1.32 03/12/2014 02:11 PM   ALT 26 02/01/2014 03:06 PM   TSH 1.570 02/01/2014 03:06 PM   HGB 14.2 05/11/2014 05:00 AM     Lipids: No results found for: LDLCALC, LDLDIRECT, CHOL, TRIG, HDL     ASSESSMENT AND PLAN: 1.  Cardiomyopathy, LVEF 40-45%: Symptomatically stable with no evidence of heart failure.  This has been deemed nonischemic and likely tachycardia mediated.  I will repeat an echocardiogram to assess for interval improvement in cardiac function.  He is currently taking Toprol-XL 50 mg daily and lisinopril 20 mg twice daily.  He has been maintained on long-acting diltiazem in spite of reduced LVEF with no issues.  2.  Permanent atrial fibrillation: Heart rate is controlled on Toprol-XL and long-acting diltiazem.  No changes to therapy.  Anticoagulated with warfarin.  3.  Hypertension: Controlled on present therapy.  No changes.  4.  Sleep apnea: Uses CPAP nightly.  This is the likely etiology of right sided chamber enlargement seen in November 2015.  I will repeat an echocardiogram to assess for interval changes.    Disposition: Follow up 1 year   Kate Sable, M.D., F.A.C.C.

## 2017-05-06 ENCOUNTER — Ambulatory Visit (HOSPITAL_COMMUNITY)
Admission: RE | Admit: 2017-05-06 | Discharge: 2017-05-06 | Disposition: A | Discharge status: Home / Self Care | Payer: PPO | Source: Ambulatory Visit | Attending: Cardiovascular Disease | Admitting: Cardiovascular Disease

## 2017-05-06 DIAGNOSIS — I119 Hypertensive heart disease without heart failure: Secondary | ICD-10-CM | POA: Insufficient documentation

## 2017-05-06 DIAGNOSIS — I429 Cardiomyopathy, unspecified: Secondary | ICD-10-CM | POA: Diagnosis not present

## 2017-05-06 DIAGNOSIS — G4730 Sleep apnea, unspecified: Secondary | ICD-10-CM | POA: Insufficient documentation

## 2017-05-06 DIAGNOSIS — K219 Gastro-esophageal reflux disease without esophagitis: Secondary | ICD-10-CM | POA: Diagnosis not present

## 2017-05-06 DIAGNOSIS — I4891 Unspecified atrial fibrillation: Secondary | ICD-10-CM | POA: Insufficient documentation

## 2017-05-06 DIAGNOSIS — E669 Obesity, unspecified: Secondary | ICD-10-CM | POA: Insufficient documentation

## 2017-05-06 DIAGNOSIS — Z6837 Body mass index (BMI) 37.0-37.9, adult: Secondary | ICD-10-CM | POA: Insufficient documentation

## 2017-05-06 DIAGNOSIS — E119 Type 2 diabetes mellitus without complications: Secondary | ICD-10-CM | POA: Diagnosis not present

## 2017-05-06 NOTE — Progress Notes (Signed)
*  PRELIMINARY RESULTS* Echocardiogram 2D Echocardiogram has been performed.  [name] 05/06/2017, 2:43 PM

## 2017-05-28 DIAGNOSIS — I4891 Unspecified atrial fibrillation: Secondary | ICD-10-CM | POA: Diagnosis not present

## 2017-06-01 DIAGNOSIS — H3562 Retinal hemorrhage, left eye: Secondary | ICD-10-CM | POA: Diagnosis not present

## 2017-06-01 DIAGNOSIS — H2513 Age-related nuclear cataract, bilateral: Secondary | ICD-10-CM | POA: Diagnosis not present

## 2017-06-01 DIAGNOSIS — E119 Type 2 diabetes mellitus without complications: Secondary | ICD-10-CM | POA: Diagnosis not present

## 2017-06-01 DIAGNOSIS — Z7984 Long term (current) use of oral hypoglycemic drugs: Secondary | ICD-10-CM | POA: Diagnosis not present

## 2017-06-03 DIAGNOSIS — H34812 Central retinal vein occlusion, left eye, with macular edema: Secondary | ICD-10-CM | POA: Diagnosis not present

## 2017-06-03 DIAGNOSIS — H2512 Age-related nuclear cataract, left eye: Secondary | ICD-10-CM | POA: Diagnosis not present

## 2017-06-03 DIAGNOSIS — H43811 Vitreous degeneration, right eye: Secondary | ICD-10-CM | POA: Diagnosis not present

## 2017-06-03 DIAGNOSIS — H2511 Age-related nuclear cataract, right eye: Secondary | ICD-10-CM | POA: Diagnosis not present

## 2017-06-07 DIAGNOSIS — L718 Other rosacea: Secondary | ICD-10-CM | POA: Diagnosis not present

## 2017-06-28 ENCOUNTER — Other Ambulatory Visit: Payer: Self-pay | Admitting: Cardiology

## 2017-06-29 DIAGNOSIS — I4891 Unspecified atrial fibrillation: Secondary | ICD-10-CM | POA: Diagnosis not present

## 2017-06-29 NOTE — Telephone Encounter (Signed)
Rx has been sent to the pharmacy electronically. ° °

## 2017-07-06 DIAGNOSIS — H3562 Retinal hemorrhage, left eye: Secondary | ICD-10-CM | POA: Diagnosis not present

## 2017-07-06 DIAGNOSIS — H43811 Vitreous degeneration, right eye: Secondary | ICD-10-CM | POA: Diagnosis not present

## 2017-07-06 DIAGNOSIS — H2511 Age-related nuclear cataract, right eye: Secondary | ICD-10-CM | POA: Diagnosis not present

## 2017-07-06 DIAGNOSIS — H34812 Central retinal vein occlusion, left eye, with macular edema: Secondary | ICD-10-CM | POA: Diagnosis not present

## 2017-07-27 DIAGNOSIS — I4891 Unspecified atrial fibrillation: Secondary | ICD-10-CM | POA: Diagnosis not present

## 2017-08-10 DIAGNOSIS — H34812 Central retinal vein occlusion, left eye, with macular edema: Secondary | ICD-10-CM | POA: Diagnosis not present

## 2017-08-10 DIAGNOSIS — H3562 Retinal hemorrhage, left eye: Secondary | ICD-10-CM | POA: Diagnosis not present

## 2017-08-10 DIAGNOSIS — H2512 Age-related nuclear cataract, left eye: Secondary | ICD-10-CM | POA: Diagnosis not present

## 2017-08-18 ENCOUNTER — Other Ambulatory Visit: Payer: Self-pay | Admitting: Cardiovascular Disease

## 2017-08-24 DIAGNOSIS — I4891 Unspecified atrial fibrillation: Secondary | ICD-10-CM | POA: Diagnosis not present

## 2017-08-31 DIAGNOSIS — H348121 Central retinal vein occlusion, left eye, with retinal neovascularization: Secondary | ICD-10-CM | POA: Diagnosis not present

## 2017-09-14 DIAGNOSIS — Z6838 Body mass index (BMI) 38.0-38.9, adult: Secondary | ICD-10-CM | POA: Diagnosis not present

## 2017-09-14 DIAGNOSIS — Z0000 Encounter for general adult medical examination without abnormal findings: Secondary | ICD-10-CM | POA: Diagnosis not present

## 2017-09-14 DIAGNOSIS — E1129 Type 2 diabetes mellitus with other diabetic kidney complication: Secondary | ICD-10-CM | POA: Diagnosis not present

## 2017-09-14 DIAGNOSIS — E063 Autoimmune thyroiditis: Secondary | ICD-10-CM | POA: Diagnosis not present

## 2017-09-14 DIAGNOSIS — N400 Enlarged prostate without lower urinary tract symptoms: Secondary | ICD-10-CM | POA: Diagnosis not present

## 2017-09-14 DIAGNOSIS — Z1389 Encounter for screening for other disorder: Secondary | ICD-10-CM | POA: Diagnosis not present

## 2017-09-15 DIAGNOSIS — Z1389 Encounter for screening for other disorder: Secondary | ICD-10-CM | POA: Diagnosis not present

## 2017-09-15 DIAGNOSIS — E1129 Type 2 diabetes mellitus with other diabetic kidney complication: Secondary | ICD-10-CM | POA: Diagnosis not present

## 2017-09-15 DIAGNOSIS — Z6838 Body mass index (BMI) 38.0-38.9, adult: Secondary | ICD-10-CM | POA: Diagnosis not present

## 2017-09-27 DIAGNOSIS — I4891 Unspecified atrial fibrillation: Secondary | ICD-10-CM | POA: Diagnosis not present

## 2017-09-28 DIAGNOSIS — H43811 Vitreous degeneration, right eye: Secondary | ICD-10-CM | POA: Diagnosis not present

## 2017-09-28 DIAGNOSIS — H348121 Central retinal vein occlusion, left eye, with retinal neovascularization: Secondary | ICD-10-CM | POA: Diagnosis not present

## 2017-09-28 DIAGNOSIS — H2512 Age-related nuclear cataract, left eye: Secondary | ICD-10-CM | POA: Diagnosis not present

## 2017-09-28 DIAGNOSIS — H2511 Age-related nuclear cataract, right eye: Secondary | ICD-10-CM | POA: Diagnosis not present

## 2017-09-28 DIAGNOSIS — H34812 Central retinal vein occlusion, left eye, with macular edema: Secondary | ICD-10-CM | POA: Diagnosis not present

## 2017-10-28 DIAGNOSIS — H2511 Age-related nuclear cataract, right eye: Secondary | ICD-10-CM | POA: Diagnosis not present

## 2017-10-28 DIAGNOSIS — H348121 Central retinal vein occlusion, left eye, with retinal neovascularization: Secondary | ICD-10-CM | POA: Diagnosis not present

## 2017-10-28 DIAGNOSIS — H34812 Central retinal vein occlusion, left eye, with macular edema: Secondary | ICD-10-CM | POA: Diagnosis not present

## 2017-10-28 DIAGNOSIS — H3562 Retinal hemorrhage, left eye: Secondary | ICD-10-CM | POA: Diagnosis not present

## 2017-10-28 DIAGNOSIS — I4891 Unspecified atrial fibrillation: Secondary | ICD-10-CM | POA: Diagnosis not present

## 2017-10-28 DIAGNOSIS — H2512 Age-related nuclear cataract, left eye: Secondary | ICD-10-CM | POA: Diagnosis not present

## 2017-10-28 DIAGNOSIS — H43811 Vitreous degeneration, right eye: Secondary | ICD-10-CM | POA: Diagnosis not present

## 2017-11-08 DIAGNOSIS — I4891 Unspecified atrial fibrillation: Secondary | ICD-10-CM | POA: Diagnosis not present

## 2017-11-30 DIAGNOSIS — I4891 Unspecified atrial fibrillation: Secondary | ICD-10-CM | POA: Diagnosis not present

## 2017-12-16 DIAGNOSIS — H2511 Age-related nuclear cataract, right eye: Secondary | ICD-10-CM | POA: Diagnosis not present

## 2017-12-16 DIAGNOSIS — H2512 Age-related nuclear cataract, left eye: Secondary | ICD-10-CM | POA: Diagnosis not present

## 2017-12-16 DIAGNOSIS — H43811 Vitreous degeneration, right eye: Secondary | ICD-10-CM | POA: Diagnosis not present

## 2017-12-16 DIAGNOSIS — H34812 Central retinal vein occlusion, left eye, with macular edema: Secondary | ICD-10-CM | POA: Diagnosis not present

## 2017-12-24 DIAGNOSIS — I4891 Unspecified atrial fibrillation: Secondary | ICD-10-CM | POA: Diagnosis not present

## 2018-01-04 DIAGNOSIS — G4733 Obstructive sleep apnea (adult) (pediatric): Secondary | ICD-10-CM | POA: Diagnosis not present

## 2018-01-06 DIAGNOSIS — H348121 Central retinal vein occlusion, left eye, with retinal neovascularization: Secondary | ICD-10-CM | POA: Diagnosis not present

## 2018-01-07 DIAGNOSIS — Z23 Encounter for immunization: Secondary | ICD-10-CM | POA: Diagnosis not present

## 2018-01-07 DIAGNOSIS — I4891 Unspecified atrial fibrillation: Secondary | ICD-10-CM | POA: Diagnosis not present

## 2018-01-31 DIAGNOSIS — I4891 Unspecified atrial fibrillation: Secondary | ICD-10-CM | POA: Diagnosis not present

## 2018-02-03 DIAGNOSIS — H34812 Central retinal vein occlusion, left eye, with macular edema: Secondary | ICD-10-CM | POA: Diagnosis not present

## 2018-02-03 DIAGNOSIS — H2511 Age-related nuclear cataract, right eye: Secondary | ICD-10-CM | POA: Diagnosis not present

## 2018-02-03 DIAGNOSIS — H3562 Retinal hemorrhage, left eye: Secondary | ICD-10-CM | POA: Diagnosis not present

## 2018-02-03 DIAGNOSIS — H348121 Central retinal vein occlusion, left eye, with retinal neovascularization: Secondary | ICD-10-CM | POA: Diagnosis not present

## 2018-02-03 DIAGNOSIS — H43811 Vitreous degeneration, right eye: Secondary | ICD-10-CM | POA: Diagnosis not present

## 2018-02-11 ENCOUNTER — Other Ambulatory Visit: Payer: Self-pay | Admitting: Cardiovascular Disease

## 2018-03-01 DIAGNOSIS — I4891 Unspecified atrial fibrillation: Secondary | ICD-10-CM | POA: Diagnosis not present

## 2018-03-09 DIAGNOSIS — I4891 Unspecified atrial fibrillation: Secondary | ICD-10-CM | POA: Diagnosis not present

## 2018-04-01 DIAGNOSIS — I4891 Unspecified atrial fibrillation: Secondary | ICD-10-CM | POA: Diagnosis not present

## 2018-04-07 DIAGNOSIS — H348121 Central retinal vein occlusion, left eye, with retinal neovascularization: Secondary | ICD-10-CM | POA: Diagnosis not present

## 2018-04-07 DIAGNOSIS — H34812 Central retinal vein occlusion, left eye, with macular edema: Secondary | ICD-10-CM | POA: Diagnosis not present

## 2018-04-07 DIAGNOSIS — H2511 Age-related nuclear cataract, right eye: Secondary | ICD-10-CM | POA: Diagnosis not present

## 2018-04-07 DIAGNOSIS — H43811 Vitreous degeneration, right eye: Secondary | ICD-10-CM | POA: Diagnosis not present

## 2018-04-12 DIAGNOSIS — I4891 Unspecified atrial fibrillation: Secondary | ICD-10-CM | POA: Diagnosis not present

## 2018-05-03 ENCOUNTER — Encounter: Payer: Self-pay | Admitting: Cardiovascular Disease

## 2018-05-03 ENCOUNTER — Ambulatory Visit: Payer: PPO | Admitting: Cardiovascular Disease

## 2018-05-03 VITALS — BP 124/82 | HR 82 | Ht 70.0 in | Wt 252.0 lb

## 2018-05-03 DIAGNOSIS — G4733 Obstructive sleep apnea (adult) (pediatric): Secondary | ICD-10-CM

## 2018-05-03 DIAGNOSIS — I429 Cardiomyopathy, unspecified: Secondary | ICD-10-CM

## 2018-05-03 DIAGNOSIS — I4891 Unspecified atrial fibrillation: Secondary | ICD-10-CM | POA: Diagnosis not present

## 2018-05-03 DIAGNOSIS — I10 Essential (primary) hypertension: Secondary | ICD-10-CM | POA: Diagnosis not present

## 2018-05-03 DIAGNOSIS — Z9989 Dependence on other enabling machines and devices: Secondary | ICD-10-CM

## 2018-05-03 DIAGNOSIS — I4821 Permanent atrial fibrillation: Secondary | ICD-10-CM | POA: Diagnosis not present

## 2018-05-03 NOTE — Patient Instructions (Addendum)
Medication Instructions:  Call us back with your instructions on the Toprol If you need a refill on your cardiac medications before your next appointment, please call your pharmacy.   Lab work: None today If you have labs (blood work) drawn today and your tests are completely normal, you will receive your results only by: Marland Kitchen MyChart Message (if you have MyChart) OR . A paper copy in the mail If you have any lab test that is abnormal or we need to change your treatment, we will call you to review the results.  Testing/Procedures: None today  Follow-Up: At [facility], you and your health needs are our priority.  As part of our continuing mission to provide you with exceptional heart care, we have created designated Provider Care Teams.  These Care Teams include your primary Cardiologist (physician) and Advanced Practice Providers (APPs -  Physician Assistants and Nurse Practitioners) who all work together to provide you with the care you need, when you need it. You will need a follow up appointment in 12 months.  Please call our office 2 months in advance to schedule this appointment.  You may see Kate Sable, MD or one of the following Advanced Practice Providers on your designated Care Team:   Bernerd Pho, PA-C [facility]) . Ermalinda Barrios, PA-C ([place])  Any Other Special Instructions Will Be Listed Below (If Applicable). None

## 2018-05-03 NOTE — Progress Notes (Signed)
SUBJECTIVE: The patient presents for follow-up of cardiomyopathy and permanent atrial fibrillation.  Echocardiogram on 05/06/2017 demonstrated normalization of left ventricular systolic function, LVEF 55 to 60%, and mild LVH.  Previous LVEF was 40 to 45%.  He walks with a cane due to a history of polio.  His wife of 23 years, [name], passed away in 2017-08-22.  He has been struggling with grief since then but has recently begun to get together with friends.  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  He denies chest pain, palpitations, leg swelling, and shortness of breath.  He has a strong faith.    Review of Systems: As per "subjective", otherwise negative.  Allergies  Allergen Reactions  . Sulfa Antibiotics Rash    Current Outpatient Medications  Medication Sig Dispense Refill  . ALPRAZolam (XANAX) 1 MG tablet Take 1 mg by mouth every 6 (six) hours as needed for anxiety.     . finasteride (PROSCAR) 5 MG tablet Take 5 mg by mouth daily.    Marland Kitchen lisinopril (PRINIVIL,ZESTRIL) 20 MG tablet Take 20 mg by mouth 2 (two) times daily.     . metFORMIN (GLUCOPHAGE) 500 MG tablet Take 500 mg by mouth 2 (two) times daily with a meal.    . metoprolol succinate (TOPROL-XL) 50 MG 24 hr tablet TAKE ONE TABLET BY MOUTH TWICE DAILY WITH FOOD OR IMMEDIATELY FOLLOWING A MEAL. 180 tablet 0  . warfarin (COUMADIN) 5 MG tablet Take 5 mg by mouth one time only at 6 PM.      No current facility-administered medications for this visit.     Past Medical History:  Diagnosis Date  . Anxiety   . Arthritis   . Complication of anesthesia    AFTER LUNG SURGERY  . Diabetes mellitus ([place])   . Dysrhythmia 03/2014   HX A-FIB WHEN HAVING COLONOSCOPY  . Hypertension   . Kidney stone   . Obesity   . Polio 1946   RESIDUAL LEFT LEG / FOOT WEAKNESS  . PONV (postoperative nausea and vomiting)   . Sleep apnea    USES C PAP    Past Surgical History:  Procedure Laterality Date  . COLONOSCOPY N/A 02/01/2014     Procedure: COLONOSCOPY;  Surgeon: Rogene Houston, MD;  [facility];  Service: Endoscopy;  Laterality: N/A;  1200  . LUNG SURGERY N/A 2004   "SCRAPPED A FUNGUS OFF MY LUNG"  . NEPHROLITHOTOMY Left 05/10/2014   Procedure: NEPHROLITHOTOMY PERCUTANEOUS;  Surgeon: Jorja Loa, MD;  Location: [facility];  Service: Urology;  Laterality: Left;  Marland Kitchen VASECTOMY      Social History   Socioeconomic History  . Marital status: Married    Spouse name: Not on file  . Number of children: Not on file  . Years of education: Not on file  . Highest education level: Not on file  Occupational History  . Not on file  Social Needs  . Financial resource strain: Not on file  . Food insecurity:    Worry: Not on file    Inability: Not on file  . Transportation needs:    Medical: Not on file    Non-medical: Not on file  Tobacco Use  . Smoking status: Former Smoker    Packs/day: 0.50    Years: 10.00    Pack years: 5.00    Types: Cigarettes    Start date: 03/31/1951    Last attempt to quit: 11/17/2002  Years since quitting: 15.4  . Smokeless tobacco: Never Used  Substance and Sexual Activity  . Alcohol use: Not Currently    Alcohol/week: 0.0 standard drinks    Comment: social  . Drug use: No  . Sexual activity: Not on file  Lifestyle  . Physical activity:    Days per week: Not on file    Minutes per session: Not on file  . Stress: Not on file  Relationships  . Social connections:    Talks on phone: Not on file    Gets together: Not on file    Attends religious service: Not on file    Active member of club or organization: Not on file    Attends meetings of clubs or organizations: Not on file    Relationship status: Not on file  . Intimate partner violence:    Fear of current or ex partner: Not on file    Emotionally abused: Not on file    Physically abused: Not on file    Forced sexual activity: Not on file  Other Topics Concern  . Not on file  Social History Narrative   . Not on file     Vitals:   05/03/18 0823  BP: 124/82  Pulse: 82  SpO2: 97%  Weight: 252 lb (114.3 kg)  Height: 5\' 10"  (1.778 m)    Wt Readings from Last 3 Encounters:  05/03/18 252 lb (114.3 kg)  05/03/17 262 lb (118.8 kg)  03/09/16 271 lb (122.9 kg)     PHYSICAL EXAM General: NAD HEENT: Rosacea of nose (rhinophyma). Neck: No JVD, no thyromegaly. Lungs: Clear to auscultation bilaterally with normal respiratory effort. CV: Regular rate and irregular rhythm, normal S1/S2, no S3, no murmur. No pretibial or periankle edema.  No carotid bruit.   Abdomen: Soft, nontender, no distention.  Neurologic: Alert and oriented.  Psych: Normal affect. Skin: Rosacea of nose (rhinophyma). Musculoskeletal: No gross deformities.    ECG: Reviewed above under Subjective   Labs: Lab Results  Component Value Date/Time   K 4.9 05/10/2014 07:55 AM   BUN 21 05/10/2014 07:55 AM   CREATININE 1.16 05/10/2014 07:55 AM   CREATININE 1.32 03/12/2014 02:11 PM   ALT 26 02/01/2014 03:06 PM   TSH 1.570 02/01/2014 03:06 PM   HGB 14.2 05/11/2014 05:00 AM     Lipids: No results found for: LDLCALC, LDLDIRECT, CHOL, TRIG, HDL     ASSESSMENT AND PLAN: 1.  Cardiomyopathy: LV systolic function has normalized by echocardiogram in February 2019, LVEF now 55 to 60% (previously 40 to 45%).  It was likely nonischemic and tachycardia mediated.  Continue Toprol-XL 50 mg daily and lisinopril 20 mg twice daily.  2.  Permanent atrial fibrillation: Heart rate controlled on Toprol-XL.  Anticoagulated with warfarin.  No changes.  3.  Hypertension: Controlled on present therapy.  No changes.  4.  Sleep apnea: Uses CPAP nightly.   Disposition: Follow up 1 year   Kate Sable, M.D., F.A.C.C.

## 2018-05-12 DIAGNOSIS — I4891 Unspecified atrial fibrillation: Secondary | ICD-10-CM | POA: Diagnosis not present

## 2018-05-19 DIAGNOSIS — I4891 Unspecified atrial fibrillation: Secondary | ICD-10-CM | POA: Diagnosis not present

## 2018-05-20 DIAGNOSIS — I10 Essential (primary) hypertension: Secondary | ICD-10-CM | POA: Diagnosis not present

## 2018-05-20 DIAGNOSIS — I4891 Unspecified atrial fibrillation: Secondary | ICD-10-CM | POA: Diagnosis not present

## 2018-05-20 DIAGNOSIS — G894 Chronic pain syndrome: Secondary | ICD-10-CM | POA: Diagnosis not present

## 2018-05-20 DIAGNOSIS — Z6837 Body mass index (BMI) 37.0-37.9, adult: Secondary | ICD-10-CM | POA: Diagnosis not present

## 2018-05-20 DIAGNOSIS — Z1389 Encounter for screening for other disorder: Secondary | ICD-10-CM | POA: Diagnosis not present

## 2018-05-20 DIAGNOSIS — E1165 Type 2 diabetes mellitus with hyperglycemia: Secondary | ICD-10-CM | POA: Diagnosis not present

## 2018-05-20 DIAGNOSIS — Z0001 Encounter for general adult medical examination with abnormal findings: Secondary | ICD-10-CM | POA: Diagnosis not present

## 2018-05-20 DIAGNOSIS — F419 Anxiety disorder, unspecified: Secondary | ICD-10-CM | POA: Diagnosis not present

## 2018-06-13 ENCOUNTER — Other Ambulatory Visit: Payer: Self-pay | Admitting: Cardiovascular Disease

## 2018-06-22 DIAGNOSIS — I4891 Unspecified atrial fibrillation: Secondary | ICD-10-CM | POA: Diagnosis not present

## 2018-06-30 ENCOUNTER — Other Ambulatory Visit: Payer: Self-pay | Admitting: Cardiology

## 2018-07-20 ENCOUNTER — Other Ambulatory Visit: Payer: Self-pay | Admitting: Cardiovascular Disease

## 2018-07-20 DIAGNOSIS — I4891 Unspecified atrial fibrillation: Secondary | ICD-10-CM | POA: Diagnosis not present

## 2018-08-16 DIAGNOSIS — I4891 Unspecified atrial fibrillation: Secondary | ICD-10-CM | POA: Diagnosis not present

## 2018-08-31 DIAGNOSIS — H2512 Age-related nuclear cataract, left eye: Secondary | ICD-10-CM | POA: Diagnosis not present

## 2018-08-31 DIAGNOSIS — H34812 Central retinal vein occlusion, left eye, with macular edema: Secondary | ICD-10-CM | POA: Diagnosis not present

## 2018-09-30 ENCOUNTER — Other Ambulatory Visit: Payer: Self-pay | Admitting: Cardiology

## 2018-10-03 DIAGNOSIS — I4891 Unspecified atrial fibrillation: Secondary | ICD-10-CM | POA: Diagnosis not present

## 2018-10-12 DIAGNOSIS — H348121 Central retinal vein occlusion, left eye, with retinal neovascularization: Secondary | ICD-10-CM | POA: Diagnosis not present

## 2018-10-12 DIAGNOSIS — H2512 Age-related nuclear cataract, left eye: Secondary | ICD-10-CM | POA: Diagnosis not present

## 2018-10-12 DIAGNOSIS — H34812 Central retinal vein occlusion, left eye, with macular edema: Secondary | ICD-10-CM | POA: Diagnosis not present

## 2018-10-12 DIAGNOSIS — H3562 Retinal hemorrhage, left eye: Secondary | ICD-10-CM | POA: Diagnosis not present

## 2018-10-17 DIAGNOSIS — I4891 Unspecified atrial fibrillation: Secondary | ICD-10-CM | POA: Diagnosis not present

## 2018-10-21 ENCOUNTER — Other Ambulatory Visit: Payer: Self-pay

## 2018-10-21 ENCOUNTER — Other Ambulatory Visit: Payer: PPO

## 2018-10-21 DIAGNOSIS — R6889 Other general symptoms and signs: Secondary | ICD-10-CM | POA: Diagnosis not present

## 2018-10-21 DIAGNOSIS — Z20822 Contact with and (suspected) exposure to covid-19: Secondary | ICD-10-CM

## 2018-10-24 ENCOUNTER — Telehealth: Payer: Self-pay

## 2018-10-24 DIAGNOSIS — I4891 Unspecified atrial fibrillation: Secondary | ICD-10-CM | POA: Diagnosis not present

## 2018-10-24 LAB — NOVEL CORONAVIRUS, NAA: SARS-CoV-2, NAA: NOT DETECTED

## 2018-10-24 NOTE — Telephone Encounter (Signed)
Pt called and informed patient that test for Covid 19 was NEGATIVE. Discussed signs and symptoms of Covid 19 : fever, chills, respiratory symptoms, cough, ENT symptoms, sore throat, SOB, muscle pain, diarrhea, headache, loss of taste/smell, close exposure to COVID-19 patient. Pt instructed to call PCP if they develop the above signs and sx. Pt also instructed to call 911 if having respiratory issues/distress. Discussed MyChart enrollment. Pt verbalized understanding.

## 2018-11-02 DIAGNOSIS — I4891 Unspecified atrial fibrillation: Secondary | ICD-10-CM | POA: Diagnosis not present

## 2018-11-02 DIAGNOSIS — L718 Other rosacea: Secondary | ICD-10-CM | POA: Diagnosis not present

## 2018-11-02 DIAGNOSIS — N400 Enlarged prostate without lower urinary tract symptoms: Secondary | ICD-10-CM | POA: Diagnosis not present

## 2018-11-02 DIAGNOSIS — E119 Type 2 diabetes mellitus without complications: Secondary | ICD-10-CM | POA: Diagnosis not present

## 2018-11-02 DIAGNOSIS — Z6836 Body mass index (BMI) 36.0-36.9, adult: Secondary | ICD-10-CM | POA: Diagnosis not present

## 2018-11-28 DIAGNOSIS — I4891 Unspecified atrial fibrillation: Secondary | ICD-10-CM | POA: Diagnosis not present

## 2018-12-13 DIAGNOSIS — H35352 Cystoid macular degeneration, left eye: Secondary | ICD-10-CM | POA: Diagnosis not present

## 2018-12-13 DIAGNOSIS — H34812 Central retinal vein occlusion, left eye, with macular edema: Secondary | ICD-10-CM | POA: Diagnosis not present

## 2018-12-27 ENCOUNTER — Other Ambulatory Visit: Payer: Self-pay | Admitting: Cardiology

## 2018-12-28 DIAGNOSIS — Z7689 Persons encountering health services in other specified circumstances: Secondary | ICD-10-CM | POA: Diagnosis not present

## 2019-01-06 DIAGNOSIS — I4891 Unspecified atrial fibrillation: Secondary | ICD-10-CM | POA: Diagnosis not present

## 2019-01-27 ENCOUNTER — Other Ambulatory Visit: Payer: Self-pay | Admitting: Cardiovascular Disease

## 2019-01-27 DIAGNOSIS — I4891 Unspecified atrial fibrillation: Secondary | ICD-10-CM | POA: Diagnosis not present

## 2019-02-02 ENCOUNTER — Other Ambulatory Visit: Payer: Self-pay

## 2019-02-02 DIAGNOSIS — I4891 Unspecified atrial fibrillation: Secondary | ICD-10-CM | POA: Diagnosis not present

## 2019-02-02 DIAGNOSIS — Z20822 Contact with and (suspected) exposure to covid-19: Secondary | ICD-10-CM

## 2019-02-02 DIAGNOSIS — Z23 Encounter for immunization: Secondary | ICD-10-CM | POA: Diagnosis not present

## 2019-02-03 ENCOUNTER — Telehealth: Payer: Self-pay | Admitting: *Deleted

## 2019-02-03 LAB — NOVEL CORONAVIRUS, NAA: SARS-CoV-2, NAA: NOT DETECTED

## 2019-02-03 NOTE — Telephone Encounter (Signed)
Pt called in requesting the result of his COVID-19 test result.   It was not processed yet.    I let him know it would be 24-48 hours and we would given him a call when it's processed and ready.  He thanked me for my help.

## 2019-02-27 DIAGNOSIS — I4891 Unspecified atrial fibrillation: Secondary | ICD-10-CM | POA: Diagnosis not present

## 2019-03-06 DIAGNOSIS — I4891 Unspecified atrial fibrillation: Secondary | ICD-10-CM | POA: Diagnosis not present

## 2019-03-07 DIAGNOSIS — H348121 Central retinal vein occlusion, left eye, with retinal neovascularization: Secondary | ICD-10-CM | POA: Diagnosis not present

## 2019-03-07 DIAGNOSIS — H34812 Central retinal vein occlusion, left eye, with macular edema: Secondary | ICD-10-CM | POA: Diagnosis not present

## 2019-03-07 DIAGNOSIS — H43811 Vitreous degeneration, right eye: Secondary | ICD-10-CM | POA: Diagnosis not present

## 2019-03-07 DIAGNOSIS — H3562 Retinal hemorrhage, left eye: Secondary | ICD-10-CM | POA: Diagnosis not present

## 2019-03-07 DIAGNOSIS — E119 Type 2 diabetes mellitus without complications: Secondary | ICD-10-CM | POA: Diagnosis not present

## 2019-03-14 DIAGNOSIS — Q667 Congenital pes cavus, unspecified foot: Secondary | ICD-10-CM | POA: Diagnosis not present

## 2019-03-14 DIAGNOSIS — I10 Essential (primary) hypertension: Secondary | ICD-10-CM | POA: Diagnosis not present

## 2019-03-14 DIAGNOSIS — E669 Obesity, unspecified: Secondary | ICD-10-CM | POA: Diagnosis not present

## 2019-03-14 DIAGNOSIS — I4891 Unspecified atrial fibrillation: Secondary | ICD-10-CM | POA: Diagnosis not present

## 2019-03-14 DIAGNOSIS — Z23 Encounter for immunization: Secondary | ICD-10-CM | POA: Diagnosis not present

## 2019-03-14 DIAGNOSIS — N400 Enlarged prostate without lower urinary tract symptoms: Secondary | ICD-10-CM | POA: Diagnosis not present

## 2019-03-14 DIAGNOSIS — E1140 Type 2 diabetes mellitus with diabetic neuropathy, unspecified: Secondary | ICD-10-CM | POA: Diagnosis not present

## 2019-03-14 DIAGNOSIS — B351 Tinea unguium: Secondary | ICD-10-CM | POA: Diagnosis not present

## 2019-03-14 DIAGNOSIS — R201 Hypoesthesia of skin: Secondary | ICD-10-CM | POA: Diagnosis not present

## 2019-03-14 DIAGNOSIS — Z6836 Body mass index (BMI) 36.0-36.9, adult: Secondary | ICD-10-CM | POA: Diagnosis not present

## 2019-03-27 ENCOUNTER — Ambulatory Visit: Payer: PPO | Attending: Family

## 2019-03-27 ENCOUNTER — Other Ambulatory Visit: Payer: Self-pay | Admitting: Cardiology

## 2019-03-27 ENCOUNTER — Other Ambulatory Visit: Payer: Self-pay

## 2019-03-27 DIAGNOSIS — Z20828 Contact with and (suspected) exposure to other viral communicable diseases: Secondary | ICD-10-CM | POA: Diagnosis not present

## 2019-03-27 DIAGNOSIS — Z20822 Contact with and (suspected) exposure to covid-19: Secondary | ICD-10-CM

## 2019-03-28 ENCOUNTER — Telehealth: Payer: Self-pay | Admitting: *Deleted

## 2019-03-28 NOTE — Telephone Encounter (Signed)
Patient called for results ,still pending . 

## 2019-03-29 ENCOUNTER — Telehealth: Payer: Self-pay

## 2019-03-29 LAB — NOVEL CORONAVIRUS, NAA: SARS-CoV-2, NAA: NOT DETECTED

## 2019-03-29 NOTE — Telephone Encounter (Signed)
Pt notified of negative COVID-19 results. Understanding verbalized.  Chasta M Hopkins   

## 2019-04-03 DIAGNOSIS — I4891 Unspecified atrial fibrillation: Secondary | ICD-10-CM | POA: Diagnosis not present

## 2019-04-19 ENCOUNTER — Other Ambulatory Visit: Payer: Self-pay

## 2019-04-19 ENCOUNTER — Ambulatory Visit: Payer: PPO | Attending: Internal Medicine

## 2019-04-19 DIAGNOSIS — Z20822 Contact with and (suspected) exposure to covid-19: Secondary | ICD-10-CM

## 2019-04-20 ENCOUNTER — Telehealth: Payer: Self-pay | Admitting: General Practice

## 2019-04-20 LAB — NOVEL CORONAVIRUS, NAA: SARS-CoV-2, NAA: NOT DETECTED

## 2019-04-20 NOTE — Telephone Encounter (Signed)
Negative COVID results given. Patient results "NOT Detected." Caller expressed understanding. ° °

## 2019-05-02 DIAGNOSIS — I4891 Unspecified atrial fibrillation: Secondary | ICD-10-CM | POA: Diagnosis not present

## 2019-05-25 DIAGNOSIS — I4891 Unspecified atrial fibrillation: Secondary | ICD-10-CM | POA: Diagnosis not present

## 2019-05-30 DIAGNOSIS — Z0001 Encounter for general adult medical examination with abnormal findings: Secondary | ICD-10-CM | POA: Diagnosis not present

## 2019-05-30 DIAGNOSIS — I4891 Unspecified atrial fibrillation: Secondary | ICD-10-CM | POA: Diagnosis not present

## 2019-05-30 DIAGNOSIS — E119 Type 2 diabetes mellitus without complications: Secondary | ICD-10-CM | POA: Diagnosis not present

## 2019-05-30 DIAGNOSIS — L718 Other rosacea: Secondary | ICD-10-CM | POA: Diagnosis not present

## 2019-05-30 DIAGNOSIS — E782 Mixed hyperlipidemia: Secondary | ICD-10-CM | POA: Diagnosis not present

## 2019-05-30 DIAGNOSIS — G8929 Other chronic pain: Secondary | ICD-10-CM | POA: Diagnosis not present

## 2019-05-30 DIAGNOSIS — N182 Chronic kidney disease, stage 2 (mild): Secondary | ICD-10-CM | POA: Diagnosis not present

## 2019-05-30 DIAGNOSIS — Z6836 Body mass index (BMI) 36.0-36.9, adult: Secondary | ICD-10-CM | POA: Diagnosis not present

## 2019-05-30 DIAGNOSIS — I10 Essential (primary) hypertension: Secondary | ICD-10-CM | POA: Diagnosis not present

## 2019-05-30 DIAGNOSIS — Z1389 Encounter for screening for other disorder: Secondary | ICD-10-CM | POA: Diagnosis not present

## 2019-05-30 DIAGNOSIS — L719 Rosacea, unspecified: Secondary | ICD-10-CM | POA: Diagnosis not present

## 2019-05-30 DIAGNOSIS — E6609 Other obesity due to excess calories: Secondary | ICD-10-CM | POA: Diagnosis not present

## 2019-06-15 ENCOUNTER — Telehealth (INDEPENDENT_AMBULATORY_CARE_PROVIDER_SITE_OTHER): Payer: PPO | Admitting: Cardiovascular Disease

## 2019-06-15 ENCOUNTER — Encounter: Payer: Self-pay | Admitting: Cardiovascular Disease

## 2019-06-15 VITALS — BP 132/80 | Ht 70.5 in | Wt 244.0 lb

## 2019-06-15 DIAGNOSIS — G4733 Obstructive sleep apnea (adult) (pediatric): Secondary | ICD-10-CM

## 2019-06-15 DIAGNOSIS — I10 Essential (primary) hypertension: Secondary | ICD-10-CM

## 2019-06-15 DIAGNOSIS — I4821 Permanent atrial fibrillation: Secondary | ICD-10-CM | POA: Diagnosis not present

## 2019-06-15 DIAGNOSIS — I429 Cardiomyopathy, unspecified: Secondary | ICD-10-CM

## 2019-06-15 NOTE — Patient Instructions (Signed)
Medication Instructions:  Your physician recommends that you continue on your current medications as directed. Please refer to the Current Medication list given to you today.   Labwork:  None today  Procedures/Testing:  None today  Follow-Up:   1 year in office with Dr.Koneswaran  Any Additional Special Instructions Will Be Listed Below (If Applicable).     If you need a refill on your cardiac medications before your next appointment, please call your pharmacy.         Thank you for choosing [facility] !

## 2019-06-15 NOTE — Progress Notes (Signed)
Virtual Visit via Telephone Note   This visit type was conducted due to national recommendations for restrictions regarding the COVID-19 Pandemic (e.g. social distancing) in an effort to limit this patient's exposure and mitigate transmission in our community.  Due to his co-morbid illnesses, this patient is at least at moderate risk for complications without adequate follow up.  This format is felt to be most appropriate for this patient at this time.  The patient did not have access to video technology/had technical difficulties with video requiring transitioning to audio format only (telephone).  All issues noted in this document were discussed and addressed.  No physical exam could be performed with this format.  Please refer to the patient's chart for his  consent to telehealth for [facility].   The patient was identified using 2 identifiers.  Date:  06/15/2019   ID:  [name], DOB 1945-04-13, MRN [id number]  Patient Location: Home Provider Location: Office  PCP:  Redmond School, MD  Cardiologist:  Kate Sable, MD  Electrophysiologist:  None   Evaluation Performed:  Follow-Up Visit  Chief Complaint:  Cardiomyopathy and permanent atrial fibrillation.  History of Present Illness:    [name] is a 74 y.o. male with cardiomyopathy and permanent atrial fibrillation.  Echocardiogram on 05/06/2017 demonstrated normalization of left ventricular systolic function, LVEF 55 to 60%, and mild LVH.  Previous LVEF was 40 to 45%.  He walks with a cane due to a history of polio.  He had an appt with his PCP about 6 weeks ago and said it went very well.  He denies chest pain, palpitations, shortness of breath, and leg swelling.  He now cooks for himself.    Soc Hx: His wife of 87 years, [name], passed away in 08/08/2017. 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  Past Medical History:  Diagnosis Date  . Anxiety   . Arthritis   . Complication of anesthesia    AFTER LUNG  SURGERY  . Diabetes mellitus ([place])   . Dysrhythmia 03/2014   HX A-FIB WHEN HAVING COLONOSCOPY  . Hypertension   . Kidney stone   . Obesity   . Polio 1946   RESIDUAL LEFT LEG / FOOT WEAKNESS  . PONV (postoperative nausea and vomiting)   . Sleep apnea    USES C PAP   Past Surgical History:  Procedure Laterality Date  . COLONOSCOPY N/A 02/01/2014   Procedure: COLONOSCOPY;  Surgeon: Rogene Houston, MD;  [facility];  Service: Endoscopy;  Laterality: N/A;  1200  . LUNG SURGERY N/A 2004   "SCRAPPED A FUNGUS OFF MY LUNG"  . NEPHROLITHOTOMY Left 05/10/2014   Procedure: NEPHROLITHOTOMY PERCUTANEOUS;  Surgeon: Jorja Loa, MD;  [facility];  Service: Urology;  Laterality: Left;  Marland Kitchen VASECTOMY       Current Meds  Medication Sig  . ALPRAZolam (XANAX) 1 MG tablet Take 1 mg by mouth every 6 (six) hours as needed for anxiety.   Marland Kitchen atorvastatin (LIPITOR) 20 MG tablet Take 20 mg by mouth daily.  Marland Kitchen diltiazem (CARDIZEM CD) 180 MG 24 hr capsule TAKE ONE CAPSULE BY MOUTH DAILY.  . finasteride (PROSCAR) 5 MG tablet Take 5 mg by mouth daily.  Marland Kitchen levothyroxine (SYNTHROID) 25 MCG tablet Take 25 mcg by mouth daily before breakfast.   . lisinopril (PRINIVIL,ZESTRIL) 20 MG tablet Take 20 mg by mouth 2 (two) times daily.   . metFORMIN (GLUCOPHAGE) 500 MG tablet Take 500 mg by mouth  2 (two) times daily with a meal.  . metoprolol succinate (TOPROL-XL) 50 MG 24 hr tablet TAKE ONE TABLET BY MOUTH TWICE DAILY WITH FOOD OR IMMEDIATELY FOLLOWING A MEAL.  Marland Kitchen warfarin (COUMADIN) 5 MG tablet Take 5 mg by mouth one time only at 6 PM.      Allergies:   Sulfa antibiotics   Social History   Tobacco Use  . Smoking status: Former Smoker    Packs/day: 0.50    Years: 10.00    Pack years: 5.00    Types: Cigarettes    Start date: 03/31/1951    Quit date: 11/17/2002    Years since quitting: 16.5  . Smokeless tobacco: Never Used  Substance Use Topics  . Alcohol use: Not Currently    Alcohol/week:  0.0 standard drinks    Comment: social  . Drug use: No     Family Hx: The patient's family history includes Brain cancer in his father; Drug abuse in his brother; Heart attack in his mother.  ROS:   Please see the history of present illness.     All other systems reviewed and are negative.   Prior CV studies:   The following studies were reviewed today:  Reviewed above  Labs/Other Tests and Data Reviewed:    EKG:  No ECG reviewed.  Recent Labs: No results found for requested labs within last 8760 hours.   Recent Lipid Panel No results found for: CHOL, TRIG, HDL, CHOLHDL, LDLCALC, LDLDIRECT  Wt Readings from Last 3 Encounters:  06/15/19 244 lb (110.7 kg)  05/03/18 252 lb (114.3 kg)  05/03/17 262 lb (118.8 kg)     Objective:    Vital Signs:  BP 132/80   Ht 5' 10.5" (1.791 m)   Wt 244 lb (110.7 kg)   BMI 34.52 kg/m    VITAL SIGNS:  reviewed  ASSESSMENT & PLAN:    1.  Cardiomyopathy: LV systolic function has normalized by echocardiogram in February 2019, LVEF now 55 to 60% (previously 40 to 45%).  It was likely nonischemic and tachycardia mediated.  Continue Toprol-XL 50 mg daily and lisinopril 20 mg twice daily.  2.  Permanent atrial fibrillation: Heart rate controlled on Toprol-XL and Cardizem CD 180 mg daily.  Anticoagulated with warfarin.  No changes.  3.  Hypertension: Controlled on present therapy.  No changes.  4.  Sleep apnea: Uses CPAP nightly.   COVID-19 Education: The signs and symptoms of COVID-19 were discussed with the patient and how to seek care for testing (follow up with PCP or arrange E-visit).  The importance of social distancing was discussed today.  Time:   Today, I have spent 15 minutes with the patient with telehealth technology discussing the above problems.     Medication Adjustments/Labs and Tests Ordered: Current medicines are reviewed at length with the patient today.  Concerns regarding medicines are outlined above.    Tests Ordered: No orders of the defined types were placed in this encounter.   Medication Changes: No orders of the defined types were placed in this encounter.   Follow Up:  In Person in 1 year(s)  Signed, Kate Sable, MD  06/15/2019 3:05 PM    [facility]

## 2019-06-21 ENCOUNTER — Other Ambulatory Visit: Payer: Self-pay | Admitting: Cardiology

## 2019-06-27 DIAGNOSIS — I4891 Unspecified atrial fibrillation: Secondary | ICD-10-CM | POA: Diagnosis not present

## 2019-07-03 DIAGNOSIS — I4891 Unspecified atrial fibrillation: Secondary | ICD-10-CM | POA: Diagnosis not present

## 2019-07-04 ENCOUNTER — Encounter (INDEPENDENT_AMBULATORY_CARE_PROVIDER_SITE_OTHER): Payer: Self-pay | Admitting: Ophthalmology

## 2019-07-04 ENCOUNTER — Ambulatory Visit (INDEPENDENT_AMBULATORY_CARE_PROVIDER_SITE_OTHER): Payer: PPO | Admitting: Ophthalmology

## 2019-07-04 ENCOUNTER — Other Ambulatory Visit: Payer: Self-pay

## 2019-07-04 DIAGNOSIS — H2511 Age-related nuclear cataract, right eye: Secondary | ICD-10-CM

## 2019-07-04 DIAGNOSIS — H43811 Vitreous degeneration, right eye: Secondary | ICD-10-CM

## 2019-07-04 DIAGNOSIS — H348121 Central retinal vein occlusion, left eye, with retinal neovascularization: Secondary | ICD-10-CM

## 2019-07-04 DIAGNOSIS — H10829 Rosacea conjunctivitis, unspecified eye: Secondary | ICD-10-CM | POA: Diagnosis not present

## 2019-07-04 DIAGNOSIS — H2512 Age-related nuclear cataract, left eye: Secondary | ICD-10-CM | POA: Diagnosis not present

## 2019-07-04 DIAGNOSIS — H34812 Central retinal vein occlusion, left eye, with macular edema: Secondary | ICD-10-CM | POA: Insufficient documentation

## 2019-07-04 DIAGNOSIS — H348122 Central retinal vein occlusion, left eye, stable: Secondary | ICD-10-CM | POA: Insufficient documentation

## 2019-07-04 NOTE — Patient Instructions (Signed)
Old and in active center retinal vein occlusion left eye now relatively nonperfused.  May 1 day need panretinal photocoagulation peripherally.  Subfoveal choroidal neovascular membrane left eye, stable and active and will observe.  Today at 9-month interval.

## 2019-07-14 DIAGNOSIS — I4891 Unspecified atrial fibrillation: Secondary | ICD-10-CM | POA: Diagnosis not present

## 2019-07-21 DIAGNOSIS — I4891 Unspecified atrial fibrillation: Secondary | ICD-10-CM | POA: Diagnosis not present

## 2019-07-31 DIAGNOSIS — I4891 Unspecified atrial fibrillation: Secondary | ICD-10-CM | POA: Diagnosis not present

## 2019-08-21 ENCOUNTER — Other Ambulatory Visit: Payer: Self-pay | Admitting: Cardiovascular Disease

## 2019-08-25 DIAGNOSIS — I4891 Unspecified atrial fibrillation: Secondary | ICD-10-CM | POA: Diagnosis not present

## 2019-09-04 DIAGNOSIS — I4891 Unspecified atrial fibrillation: Secondary | ICD-10-CM | POA: Diagnosis not present

## 2019-09-11 DIAGNOSIS — I4891 Unspecified atrial fibrillation: Secondary | ICD-10-CM | POA: Diagnosis not present

## 2019-10-03 ENCOUNTER — Other Ambulatory Visit: Payer: Self-pay

## 2019-10-03 ENCOUNTER — Encounter (INDEPENDENT_AMBULATORY_CARE_PROVIDER_SITE_OTHER): Payer: PPO | Admitting: Ophthalmology

## 2019-10-03 ENCOUNTER — Ambulatory Visit (INDEPENDENT_AMBULATORY_CARE_PROVIDER_SITE_OTHER): Payer: PPO | Admitting: Ophthalmology

## 2019-10-03 ENCOUNTER — Encounter (INDEPENDENT_AMBULATORY_CARE_PROVIDER_SITE_OTHER): Payer: Self-pay | Admitting: Ophthalmology

## 2019-10-03 DIAGNOSIS — H348122 Central retinal vein occlusion, left eye, stable: Secondary | ICD-10-CM

## 2019-10-03 DIAGNOSIS — H348121 Central retinal vein occlusion, left eye, with retinal neovascularization: Secondary | ICD-10-CM

## 2019-10-03 DIAGNOSIS — H2513 Age-related nuclear cataract, bilateral: Secondary | ICD-10-CM

## 2019-10-03 DIAGNOSIS — H34812 Central retinal vein occlusion, left eye, with macular edema: Secondary | ICD-10-CM

## 2019-10-03 NOTE — Assessment & Plan Note (Signed)
This condition is resolved OS

## 2019-10-03 NOTE — Progress Notes (Signed)
10/03/2019     CHIEF COMPLAINT Patient presents for Retina Follow Up   HISTORY OF PRESENT ILLNESS: [name] is a 74 y.o. male who presents to the clinic today for:   HPI    Retina Follow Up    Patient presents with  Other.  In both eyes.  Duration of 3 months.  Since onset it is stable.          Comments    3 month follow up - OCT OU, FP OU Patient denies change in vision and overall has no complaints. A1C 6.? /// LBS unknown       Last edited by Gerda Diss on 10/03/2019  9:54 AM. (History)      Referring physician: Redmond School, MD [address] [place],  [place] [address]  HISTORICAL INFORMATION:   Selected notes from the MEDICAL RECORD NUMBER    Lab Results  Component Value Date   HGBA1C 7.2 (H) 02/01/2014     CURRENT MEDICATIONS: No current outpatient medications on file. (Ophthalmic Drugs)   No current facility-administered medications for this visit. (Ophthalmic Drugs)   Current Outpatient Medications (Other)  Medication Sig  . ALPRAZolam (XANAX) 1 MG tablet Take 1 mg by mouth every 6 (six) hours as needed for anxiety.   Marland Kitchen atorvastatin (LIPITOR) 20 MG tablet Take 20 mg by mouth daily.  Marland Kitchen diltiazem (CARDIZEM CD) 180 MG 24 hr capsule TAKE ONE CAPSULE BY MOUTH DAILY.  . finasteride (PROSCAR) 5 MG tablet Take 5 mg by mouth daily.  Marland Kitchen levothyroxine (SYNTHROID) 25 MCG tablet Take 25 mcg by mouth daily before breakfast.   . lisinopril (PRINIVIL,ZESTRIL) 20 MG tablet Take 20 mg by mouth 2 (two) times daily.   . metFORMIN (GLUCOPHAGE) 500 MG tablet Take 500 mg by mouth 2 (two) times daily with a meal.  . metoprolol succinate (TOPROL-XL) 50 MG 24 hr tablet TAKE ONE TABLET BY MOUTH TWICE DAILY WITH FOOD OR IMMEDIATELY FOLLOWING A MEAL.  Marland Kitchen oxyCODONE (OXY IR/ROXICODONE) 5 MG immediate release tablet Take by mouth.  . warfarin (COUMADIN) 5 MG tablet Take 5 mg by mouth one time only at 6 PM.    No current facility-administered medications for this  visit. (Other)      REVIEW OF SYSTEMS:    ALLERGIES Allergies  Allergen Reactions  . Sulfa Antibiotics Rash    PAST MEDICAL HISTORY Past Medical History:  Diagnosis Date  . Anxiety   . Arthritis   . Complication of anesthesia    AFTER LUNG SURGERY  . Diabetes mellitus ([place])   . Dysrhythmia 03/2014   HX A-FIB WHEN HAVING COLONOSCOPY  . Hypertension   . Kidney stone   . Obesity   . Polio 1946   RESIDUAL LEFT LEG / FOOT WEAKNESS  . PONV (postoperative nausea and vomiting)   . Sleep apnea    USES C PAP   Past Surgical History:  Procedure Laterality Date  . COLONOSCOPY N/A 02/01/2014   Procedure: COLONOSCOPY;  Surgeon: Rogene Houston, MD;  [facility];  Service: Endoscopy;  Laterality: N/A;  1200  . LUNG SURGERY N/A 2004   "SCRAPPED A FUNGUS OFF MY LUNG"  . NEPHROLITHOTOMY Left 05/10/2014   Procedure: NEPHROLITHOTOMY PERCUTANEOUS;  Surgeon: Jorja Loa, MD;  [facility];  Service: Urology;  Laterality: Left;  Marland Kitchen VASECTOMY      FAMILY HISTORY Family History  Problem Relation Age of Onset  . Heart attack Mother   . Brain cancer Father   .  Drug abuse Brother     SOCIAL HISTORY Social History   Tobacco Use  . Smoking status: Former Smoker    Packs/day: 0.50    Years: 10.00    Pack years: 5.00    Types: Cigarettes    Start date: 03/31/1951    Quit date: 11/17/2002    Years since quitting: 16.8  . Smokeless tobacco: Never Used  Vaping Use  . Vaping Use: Never used  Substance Use Topics  . Alcohol use: Not Currently    Alcohol/week: 0.0 standard drinks    Comment: social  . Drug use: No         OPHTHALMIC EXAM:  Base Eye Exam    Visual Acuity (Snellen - Linear)      Right Left   Dist [place] 20/30-2 CF @ 7'   Dist ph Jenkins  NI       Tonometry (Tonopen, 9:58 AM)      Right Left   Pressure 15 14       Pupils      Pupils Dark Light Shape React APD   Right PERRL 5 4 Round Slow None   Left PERRL 5 4 Round Slow None        Visual Fields (Counting fingers)      Left Right    Full Full       Extraocular Movement      Right Left    Full Full       Neuro/Psych    Oriented x3: Yes   Mood/Affect: Normal       Dilation    Both eyes: 1.0% Mydriacyl, 2.5% Phenylephrine @ 9:58 AM        Slit Lamp and Fundus Exam    External Exam      Right Left   External Normal Normal       Slit Lamp Exam      Right Left   Lids/Lashes Normal Normal   Conjunctiva/Sclera White and quiet White and quiet   Cornea Clear Clear   Anterior Chamber Deep and quiet Deep and quiet   Iris Round and reactive Round and reactive   Anterior Vitreous Normal Normal       Fundus Exam      Right Left   Posterior Vitreous Posterior vitreous detachment Posterior vitreous detachment   Disc Normal Collaterals on the nerve   C/D Ratio 0.45 0.45   Macula Normal Disciform scar   Vessels Normal  old central retinal vein occlusion, hemicentral inferiorly,, compensated   Periphery Normal Normal          IMAGING AND PROCEDURES  Imaging and Procedures for 10/03/19  OCT, Retina - OU - Both Eyes       Right Eye Quality was good. Scan locations included subfoveal. Central Foveal Thickness: 260. Progression has been stable. Findings include abnormal foveal contour, normal observations.   Left Eye Quality was good. Scan locations included subfoveal. Central Foveal Thickness: 314. Progression has been stable. Findings include no SRF, no IRF, subretinal scarring, disciform scar.        Color Fundus Photography Optos - OU - Both Eyes       Right Eye Progression has been stable. Disc findings include normal observations. Macula : normal observations. Vessels : normal observations. Periphery : normal observations.   Left Eye Progression has improved. Disc findings include normal observations. Macula : microaneurysms. Periphery : normal observations.   Notes Old central retinal vein occlusion left eye, now involutional  compensated not  active.  Foveal disciform scar                ASSESSMENT/PLAN:  Nuclear sclerotic cataract of both eyes The nature of cataract was discussed with the patient as well as the elective nature of surgery. The patient was reassured that surgery at a later date does not put the patient at risk for a worse outcome. It was emphasized that the need for surgery is dictated by the patient's quality of life as influenced by the cataract. Patient was instructed to maintain close follow up with their general eye care doctor.  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 Central retinal vein occlusion, left eye, stable  H34.8122 OCT, Retina - OU - Both Eyes    Color Fundus Photography Optos - OU - Both Eyes  2. Hemispheric retinal vein occlusion with macular edema of left eye  H34.8120   3. Nuclear sclerotic cataract of both eyes  H25.13   4. 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  OS with a history of hemispheric central retinal vein occlusion inferiorly OS with secondary macular edema.  The macular edema has now completely resolved into subretinal fibrosis.  Previous retinal neovascularization has subsided.  Patient appears to be collateralized with optic nerve collaterals on medical examination.  Will observe  2.  Follow-up Dr. Rutherford Guys as scheduled for cataract evaluations OU.  3.  Ophthalmic Meds Ordered this visit:  No orders of the defined types were placed in this encounter.      Return in about 9 months (around 07/03/2020) for DILATE OU, OCT.  There are no Patient Instructions on file for this visit.   Explained the diagnoses, plan, and follow up with the patient and they expressed understanding.  Patient expressed understanding of the importance of proper follow up care.   Clent Demark Richrd Kuzniar M.D. Diseases & Surgery of the Retina and  Vitreous Retina & Diabetic [place] 10/03/19     Abbreviations: M myopia (nearsighted); A astigmatism; H hyperopia (farsighted); P presbyopia; Mrx spectacle prescription;  CTL contact lenses; OD right eye; OS left eye; OU both eyes  XT exotropia; ET esotropia; PEK punctate epithelial keratitis; PEE punctate epithelial erosions; DES dry eye syndrome; MGD meibomian gland dysfunction; ATs artificial tears; PFAT's preservative free artificial tears; [place] nuclear sclerotic cataract; PSC posterior subcapsular cataract; ERM epi-retinal membrane; PVD posterior vitreous detachment; RD retinal detachment; DM diabetes mellitus; DR diabetic retinopathy; NPDR non-proliferative diabetic retinopathy; PDR proliferative diabetic retinopathy; CSME clinically significant macular edema; DME diabetic macular edema; dbh dot blot hemorrhages; CWS cotton wool spot; POAG primary open angle glaucoma; C/D cup-to-disc ratio; HVF humphrey visual field; GVF goldmann visual field; OCT optical coherence tomography; IOP intraocular pressure; BRVO Branch retinal vein occlusion; CRVO central retinal vein occlusion; CRAO central retinal artery occlusion; BRAO branch retinal artery occlusion; RT retinal tear; SB scleral buckle; PPV pars plana vitrectomy; VH Vitreous hemorrhage; PRP panretinal laser photocoagulation; IVK intravitreal kenalog; VMT vitreomacular traction; MH Macular hole;  NVD neovascularization of the disc; NVE neovascularization elsewhere; AREDS age related eye disease study; ARMD age related macular degeneration; POAG primary open angle glaucoma; EBMD epithelial/anterior basement membrane dystrophy; ACIOL anterior chamber intraocular lens; IOL intraocular lens; PCIOL posterior chamber intraocular lens; Phaco/IOL phacoemulsification with intraocular lens placement; [place] photorefractive keratectomy; LASIK laser assisted in situ keratomileusis; HTN hypertension; DM diabetes mellitus; COPD chronic obstructive  pulmonary disease

## 2019-10-03 NOTE — Assessment & Plan Note (Signed)

## 2019-10-10 DIAGNOSIS — I4891 Unspecified atrial fibrillation: Secondary | ICD-10-CM | POA: Diagnosis not present

## 2019-11-10 DIAGNOSIS — Z6837 Body mass index (BMI) 37.0-37.9, adult: Secondary | ICD-10-CM | POA: Diagnosis not present

## 2019-11-10 DIAGNOSIS — G4733 Obstructive sleep apnea (adult) (pediatric): Secondary | ICD-10-CM | POA: Diagnosis not present

## 2019-11-10 DIAGNOSIS — E1129 Type 2 diabetes mellitus with other diabetic kidney complication: Secondary | ICD-10-CM | POA: Diagnosis not present

## 2019-11-10 DIAGNOSIS — I4891 Unspecified atrial fibrillation: Secondary | ICD-10-CM | POA: Diagnosis not present

## 2019-11-10 DIAGNOSIS — F419 Anxiety disorder, unspecified: Secondary | ICD-10-CM | POA: Diagnosis not present

## 2019-11-17 DIAGNOSIS — I4891 Unspecified atrial fibrillation: Secondary | ICD-10-CM | POA: Diagnosis not present

## 2019-11-27 DIAGNOSIS — I4891 Unspecified atrial fibrillation: Secondary | ICD-10-CM | POA: Diagnosis not present

## 2019-12-05 DIAGNOSIS — I4891 Unspecified atrial fibrillation: Secondary | ICD-10-CM | POA: Diagnosis not present

## 2020-01-08 DIAGNOSIS — I4891 Unspecified atrial fibrillation: Secondary | ICD-10-CM | POA: Diagnosis not present

## 2020-01-16 DIAGNOSIS — I4891 Unspecified atrial fibrillation: Secondary | ICD-10-CM | POA: Diagnosis not present

## 2020-02-19 DIAGNOSIS — I4891 Unspecified atrial fibrillation: Secondary | ICD-10-CM | POA: Diagnosis not present

## 2020-02-26 DIAGNOSIS — I4891 Unspecified atrial fibrillation: Secondary | ICD-10-CM | POA: Diagnosis not present

## 2020-03-04 DIAGNOSIS — I4891 Unspecified atrial fibrillation: Secondary | ICD-10-CM | POA: Diagnosis not present

## 2020-03-12 DIAGNOSIS — I4891 Unspecified atrial fibrillation: Secondary | ICD-10-CM | POA: Diagnosis not present

## 2020-03-19 DIAGNOSIS — I4891 Unspecified atrial fibrillation: Secondary | ICD-10-CM | POA: Diagnosis not present

## 2020-03-25 ENCOUNTER — Other Ambulatory Visit: Payer: Self-pay | Admitting: Cardiology

## 2020-04-22 DIAGNOSIS — I4891 Unspecified atrial fibrillation: Secondary | ICD-10-CM | POA: Diagnosis not present

## 2020-05-01 DIAGNOSIS — I4891 Unspecified atrial fibrillation: Secondary | ICD-10-CM | POA: Diagnosis not present

## 2020-05-22 ENCOUNTER — Other Ambulatory Visit: Payer: Self-pay | Admitting: Cardiology

## 2020-05-27 DIAGNOSIS — I4891 Unspecified atrial fibrillation: Secondary | ICD-10-CM | POA: Diagnosis not present

## 2020-06-11 DIAGNOSIS — E063 Autoimmune thyroiditis: Secondary | ICD-10-CM | POA: Diagnosis not present

## 2020-06-11 DIAGNOSIS — I10 Essential (primary) hypertension: Secondary | ICD-10-CM | POA: Diagnosis not present

## 2020-06-11 DIAGNOSIS — Z6836 Body mass index (BMI) 36.0-36.9, adult: Secondary | ICD-10-CM | POA: Diagnosis not present

## 2020-06-11 DIAGNOSIS — F419 Anxiety disorder, unspecified: Secondary | ICD-10-CM | POA: Diagnosis not present

## 2020-06-11 DIAGNOSIS — Z0000 Encounter for general adult medical examination without abnormal findings: Secondary | ICD-10-CM | POA: Diagnosis not present

## 2020-06-11 DIAGNOSIS — I4891 Unspecified atrial fibrillation: Secondary | ICD-10-CM | POA: Diagnosis not present

## 2020-06-11 DIAGNOSIS — E1129 Type 2 diabetes mellitus with other diabetic kidney complication: Secondary | ICD-10-CM | POA: Diagnosis not present

## 2020-06-11 DIAGNOSIS — G894 Chronic pain syndrome: Secondary | ICD-10-CM | POA: Diagnosis not present

## 2020-06-11 DIAGNOSIS — N182 Chronic kidney disease, stage 2 (mild): Secondary | ICD-10-CM | POA: Diagnosis not present

## 2020-06-11 DIAGNOSIS — Z8612 Personal history of poliomyelitis: Secondary | ICD-10-CM | POA: Diagnosis not present

## 2020-06-11 DIAGNOSIS — Z1331 Encounter for screening for depression: Secondary | ICD-10-CM | POA: Diagnosis not present

## 2020-06-25 DIAGNOSIS — I4891 Unspecified atrial fibrillation: Secondary | ICD-10-CM | POA: Diagnosis not present

## 2020-07-01 NOTE — Progress Notes (Signed)
The patient was identified using 2 identifiers.  Date:  07/10/2020   ID:  [name], DOB 1945/10/05, MRN [id number]  PCP:  Redmond School, MD  Cardiologist:  Kate Sable, MD (Inactive) New Electrophysiologist:  None   Chief Complaint:  Cardiomyopathy and permanent atrial fibrillation.  History of Present Illness:    [name] is a 74 y.o. male with cardiomyopathy and permanent atrial fibrillation. New To me previously seen by SK He is on statin for HLD, DM-2, hypothyroidism   Echocardiogram on 05/06/2017 demonstrated normalization of left ventricular systolic function, LVEF 55 to 60%, and mild LVH.  Previous LVEF was 40 to 45%.2015  Activity limited by leg weakness and history of polio Uses a cane to ambulate   He is on coumadin for his chronic afib. Prefers this due to cost  Lives with step daughter Been married 4 times and now widowed 2 years    Soc Hx: [name], [name], passed away in 25-Jul-2017. 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  Past Medical History:  Diagnosis Date  . Anxiety   . Arthritis   . Complication of anesthesia    AFTER LUNG SURGERY  . Diabetes mellitus ([place])   . Dysrhythmia 03/2014   HX A-FIB WHEN HAVING COLONOSCOPY  . Hypertension   . Kidney stone   . Obesity   . Polio 1946   RESIDUAL LEFT LEG / FOOT WEAKNESS  . PONV (postoperative nausea and vomiting)   . Sleep apnea    USES C PAP   Past Surgical History:  Procedure Laterality Date  . COLONOSCOPY N/A 02/01/2014   Procedure: COLONOSCOPY;  Surgeon: Rogene Houston, MD;  [facility];  Service: Endoscopy;  Laterality: N/A;  1200  . LUNG SURGERY N/A 2004   "SCRAPPED A FUNGUS OFF MY LUNG"  . NEPHROLITHOTOMY Left 05/10/2014   Procedure: NEPHROLITHOTOMY PERCUTANEOUS;  Surgeon: Jorja Loa, MD;  Location: [facility];  Service: Urology;  Laterality: Left;  Marland Kitchen VASECTOMY       Current Meds  Medication Sig  . ALPRAZolam (XANAX) 1 MG tablet Take 1 mg by  mouth every 6 (six) hours as needed for anxiety.   Marland Kitchen atorvastatin (LIPITOR) 20 MG tablet Take 20 mg by mouth daily.  Marland Kitchen diltiazem (CARDIZEM CD) 180 MG 24 hr capsule TAKE ONE CAPSULE BY MOUTH DAILY.  . finasteride (PROSCAR) 5 MG tablet Take 5 mg by mouth daily.  Marland Kitchen levothyroxine (SYNTHROID) 25 MCG tablet Take 25 mcg by mouth daily before breakfast.   . lisinopril (PRINIVIL,ZESTRIL) 20 MG tablet Take 20 mg by mouth 2 (two) times daily.   . metFORMIN (GLUCOPHAGE) 500 MG tablet Take 500 mg by mouth 2 (two) times daily with a meal.  . metoprolol succinate (TOPROL-XL) 50 MG 24 hr tablet TAKE ONE TABLET BY MOUTH TWICE DAILY WITH FOOD OR IMMEDIATELY FOLLOWING A MEAL.  Marland Kitchen oxyCODONE (OXY IR/ROXICODONE) 5 MG immediate release tablet Take by mouth.  . warfarin (COUMADIN) 5 MG tablet Take 5 mg by mouth one time only at 6 PM.      Allergies:   Sulfa antibiotics and Sulfasalazine   Social History   Tobacco Use  . Smoking status: Former Smoker    Packs/day: 0.50    Years: 10.00    Pack years: 5.00    Types: Cigarettes    Start date: 03/31/1951    Quit date: 11/17/2002    Years since quitting: 17.6  . Smokeless tobacco:  Never Used  Vaping Use  . Vaping Use: Never used  Substance Use Topics  . Alcohol use: Not Currently    Alcohol/week: 0.0 standard drinks    Comment: social  . Drug use: No     Family Hx: The patient's family history includes Brain cancer in his father; Drug abuse in his brother; Heart attack in his mother.  ROS:   Please see the history of present illness.     All other systems reviewed and are negative.   Prior CV studies:   The following studies were reviewed today:  Reviewed above  Labs/Other Tests and Data Reviewed:    EKG:  No ECG reviewed.  Recent Labs: No results found for requested labs within last 8760 hours.   Recent Lipid Panel No results found for: CHOL, TRIG, HDL, CHOLHDL, LDLCALC, LDLDIRECT  Wt Readings from Last 3 Encounters:  07/10/20 114.4 kg   06/15/19 110.7 kg  05/03/18 114.3 kg     Objective:    Vital Signs:  BP 128/82   Pulse 91   Ht 5\' 10"  (1.778 m)   Wt 114.4 kg   SpO2 97%   BMI 36.20 kg/m    Affect appropriate Healthy:  appears stated age HEENT: normal Neck supple with no adenopathy JVP normal no bruits no thyromegaly Lungs clear with no wheezing and good diaphragmatic motion Heart:  S1/S2 no murmur, no rub, gallop or click PMI normal Abdomen: benighn, BS positve, no tenderness, no AAA no bruit.  No HSM or HJR Distal pulses intact with no bruits No edema Neuro non-focal Skin warm and dry LE muscle weakness polio    ASSESSMENT & PLAN:    1.  Cardiomyopathy: Etiology never defined no stress testing, CT or cath ever done. ? Rate Related from afib EF normalized by TTE 2019 will update echo    2.  Permanent atrial fibrillation: Heart rate controlled on Toprol-XL and Cardizem CD 180 mg daily.  Anticoagulated with warfarin.  Discussed DOAC with him but he prefers coumadin   3.  Hypertension: Controlled on present therapy.  No changes.  4.  Sleep apnea: Uses CPAP nightly.  5. HLD continue statin labs with primary   6. Hypothyroidism:  Continue synthroid TSH with primary   7. DM:  Discussed low carb diet.  Target hemoglobin A1c is 6.5 or less.  Continue current medications.     Medication Adjustments/Labs and Tests Ordered: Current medicines are reviewed at length with the patient today.  Concerns regarding medicines are outlined above.   Tests Ordered: Orders Placed This Encounter  Procedures  . EKG 12-Lead  Echo for DCM    Medication Changes: No orders of the defined types were placed in this encounter.   Follow Up:  In Person in 1 year(s)  Signed, Jenkins Rouge, MD  07/10/2020 10:39 AM    [place]

## 2020-07-04 ENCOUNTER — Encounter (INDEPENDENT_AMBULATORY_CARE_PROVIDER_SITE_OTHER): Payer: Self-pay | Admitting: Ophthalmology

## 2020-07-04 ENCOUNTER — Ambulatory Visit (INDEPENDENT_AMBULATORY_CARE_PROVIDER_SITE_OTHER): Payer: HMO | Admitting: Ophthalmology

## 2020-07-04 ENCOUNTER — Other Ambulatory Visit: Payer: Self-pay

## 2020-07-04 DIAGNOSIS — H348122 Central retinal vein occlusion, left eye, stable: Secondary | ICD-10-CM

## 2020-07-04 DIAGNOSIS — H34812 Central retinal vein occlusion, left eye, with macular edema: Secondary | ICD-10-CM | POA: Diagnosis not present

## 2020-07-04 DIAGNOSIS — H2513 Age-related nuclear cataract, bilateral: Secondary | ICD-10-CM

## 2020-07-04 NOTE — Progress Notes (Signed)
07/04/2020     CHIEF COMPLAINT Patient presents for Retina Follow Up (9 Mo F/U OU//Pt denies noticeable changes to [place] OU since last visit. Pt denies ocular pain, flashes of light, or floaters OU. //)   HISTORY OF PRESENT ILLNESS: [name] is a 75 y.o. male who presents to the clinic today for:   HPI    Retina Follow Up    Patient presents with  CRVO/BRVO.  In left eye.  This started 9 months ago.  Severity is moderate.  Duration of 9 months.  Since onset it is stable. Additional comments: 9 Mo F/U OU  Pt denies noticeable changes to [place] OU since last visit. Pt denies ocular pain, flashes of light, or floaters OU.          Last edited by Rockie Neighbours, [place] on 07/04/2020  9:59 AM. (History)      Referring physician: Redmond School, MD [address] [place],  [place] [phone]  HISTORICAL INFORMATION:   Selected notes from the MEDICAL RECORD NUMBER    Lab Results  Component Value Date   HGBA1C 7.2 (H) 02/01/2014     CURRENT MEDICATIONS: No current outpatient medications on file. (Ophthalmic Drugs)   No current facility-administered medications for this visit. (Ophthalmic Drugs)   Current Outpatient Medications (Other)  Medication Sig  . ALPRAZolam (XANAX) 1 MG tablet Take 1 mg by mouth every 6 (six) hours as needed for anxiety.   Marland Kitchen atorvastatin (LIPITOR) 20 MG tablet Take 20 mg by mouth daily.  Marland Kitchen diltiazem (CARDIZEM CD) 180 MG 24 hr capsule TAKE ONE CAPSULE BY MOUTH DAILY.  . finasteride (PROSCAR) 5 MG tablet Take 5 mg by mouth daily.  Marland Kitchen levothyroxine (SYNTHROID) 25 MCG tablet Take 25 mcg by mouth daily before breakfast.   . lisinopril (PRINIVIL,ZESTRIL) 20 MG tablet Take 20 mg by mouth 2 (two) times daily.   . metFORMIN (GLUCOPHAGE) 500 MG tablet Take 500 mg by mouth 2 (two) times daily with a meal.  . metoprolol succinate (TOPROL-XL) 50 MG 24 hr tablet TAKE ONE TABLET BY MOUTH TWICE DAILY WITH FOOD OR IMMEDIATELY FOLLOWING A MEAL.  Marland Kitchen oxyCODONE (OXY  IR/ROXICODONE) 5 MG immediate release tablet Take by mouth.  . warfarin (COUMADIN) 5 MG tablet Take 5 mg by mouth one time only at 6 PM.    No current facility-administered medications for this visit. (Other)      REVIEW OF SYSTEMS:    ALLERGIES Allergies  Allergen Reactions  . Sulfa Antibiotics Rash    PAST MEDICAL HISTORY Past Medical History:  Diagnosis Date  . Anxiety   . Arthritis   . Complication of anesthesia    AFTER LUNG SURGERY  . Diabetes mellitus ([place])   . Dysrhythmia 03/2014   HX A-FIB WHEN HAVING COLONOSCOPY  . Hypertension   . Kidney stone   . Obesity   . Polio 1946   RESIDUAL LEFT LEG / FOOT WEAKNESS  . PONV (postoperative nausea and vomiting)   . Sleep apnea    USES C PAP   Past Surgical History:  Procedure Laterality Date  . COLONOSCOPY N/A 02/01/2014   Procedure: COLONOSCOPY;  Surgeon: Rogene Houston, MD;  [facility];  Service: Endoscopy;  Laterality: N/A;  1200  . LUNG SURGERY N/A 2004   "SCRAPPED A FUNGUS OFF MY LUNG"  . NEPHROLITHOTOMY Left 05/10/2014   Procedure: NEPHROLITHOTOMY PERCUTANEOUS;  Surgeon: Jorja Loa, MD;  [facility];  Service: Urology;  Laterality: Left;  .  VASECTOMY      FAMILY HISTORY Family History  Problem Relation Age of Onset  . Heart attack Mother   . Brain cancer Father   . Drug abuse Brother     SOCIAL HISTORY Social History   Tobacco Use  . Smoking status: Former Smoker    Packs/day: 0.50    Years: 10.00    Pack years: 5.00    Types: Cigarettes    Start date: 03/31/1951    Quit date: 11/17/2002    Years since quitting: 17.6  . Smokeless tobacco: Never Used  Vaping Use  . Vaping Use: Never used  Substance Use Topics  . Alcohol use: Not Currently    Alcohol/week: 0.0 standard drinks    Comment: social  . Drug use: No         OPHTHALMIC EXAM: Base Eye Exam    Visual Acuity (ETDRS)      Right Left   Dist [place] 20/25 -2 CF at 3'   Dist ph   20/400       Tonometry  (Tonopen, 10:00 AM)      Right Left   Pressure 13 14       Pupils      Pupils Dark Light Shape React APD   Right PERRL 4 3 Round Sluggish None   Left PERRL 4 3 Round Sluggish None       Visual Fields (Counting fingers)      Left Right     Full   Restrictions Total superior nasal deficiency        Extraocular Movement      Right Left    Full Full       Neuro/Psych    Oriented x3: Yes   Mood/Affect: Normal       Dilation    Both eyes: 1.0% Mydriacyl, 2.5% Phenylephrine @ 10:05 AM        Slit Lamp and Fundus Exam    External Exam      Right Left   External Normal Normal       Slit Lamp Exam      Right Left   Lids/Lashes Normal Normal   Conjunctiva/Sclera White and quiet White and quiet   Cornea Clear Clear   Anterior Chamber Deep and quiet Deep and quiet   Iris Round and reactive Round and reactive   Anterior Vitreous Normal Normal       Fundus Exam      Right Left   Posterior Vitreous Posterior vitreous detachment Posterior vitreous detachment   Disc Normal Collaterals on the nerve   C/D Ratio 0.45 0.45   Macula Normal Disciform scar, no active leakage   Vessels Normal  old central retinal vein occlusion, hemicentral inferiorly,, compensated   Periphery Normal Normal          IMAGING AND PROCEDURES  Imaging and Procedures for 07/04/20  OCT, Retina - OU - Both Eyes       Right Eye Quality was good. Scan locations included subfoveal. Central Foveal Thickness: 255. Progression has been stable. Findings include abnormal foveal contour, normal observations.   Left Eye Quality was good. Scan locations included subfoveal. Central Foveal Thickness: 310. Progression has been stable. Findings include no SRF, no IRF, subretinal scarring, disciform scar.   Notes Stable over time, no complications over the last 79-month interval                ASSESSMENT/PLAN:  Nuclear sclerotic cataract of both eyes I discussed these findings  with the patient.   I do recommend follow-up with Dr. Rutherford Guys to consideration of cataract extraction with intraocular lens placement in each eye to maximize visual potential.  I explained to have no impact or worsening effect on the macular of the retina of either eye.  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  No ongoing treatment needed at this time.      ICD-10-CM   1. 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 Nuclear sclerotic cataract of both eyes  H25.13   3. 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  No active retinal disease either eye at this time will continue to monitor and observe  2.  Moderate nuclear sclerotic cataract in each eye.  Follow with Dr. Gershon Crane as scheduled  3.  Ophthalmic Meds Ordered this visit:  No orders of the defined types were placed in this encounter.      Return in about 1 year (around 07/04/2021) for DILATE OU, COLOR FP, OCT.  There are no Patient Instructions on file for this visit.   Explained the diagnoses, plan, and follow up with the patient and they expressed understanding.  Patient expressed understanding of the importance of proper follow up care.   Clent Demark Yakir Wenke M.D. Diseases & Surgery of the Retina and Vitreous [facility] [place] 07/04/20     Abbreviations: M myopia (nearsighted); A astigmatism; H hyperopia (farsighted); P presbyopia; Mrx spectacle prescription;  CTL contact lenses; OD right eye; OS left  eye; OU both eyes  XT exotropia; ET esotropia; PEK punctate epithelial keratitis; PEE punctate epithelial erosions; DES dry eye syndrome; MGD meibomian gland dysfunction; ATs artificial tears; PFAT's preservative free artificial tears; [place] nuclear sclerotic cataract; PSC posterior subcapsular cataract; ERM epi-retinal membrane; PVD posterior vitreous detachment; RD retinal detachment; DM diabetes mellitus; DR diabetic retinopathy; NPDR non-proliferative diabetic retinopathy; PDR proliferative diabetic retinopathy; CSME clinically significant macular edema; DME diabetic macular edema; dbh dot blot hemorrhages; CWS cotton wool spot; POAG primary open angle glaucoma; C/D cup-to-disc ratio; HVF humphrey visual field; GVF goldmann visual field; OCT optical coherence tomography; IOP intraocular pressure; BRVO Branch retinal vein occlusion; CRVO central retinal vein occlusion; CRAO central retinal artery occlusion; BRAO branch retinal artery occlusion; RT retinal tear; SB scleral buckle; PPV pars plana vitrectomy; VH Vitreous hemorrhage; PRP panretinal laser photocoagulation; IVK intravitreal kenalog; VMT vitreomacular traction; MH Macular hole;  NVD neovascularization of the disc; NVE neovascularization elsewhere; AREDS age related eye disease study; ARMD age related macular degeneration; POAG primary open angle glaucoma; EBMD epithelial/anterior basement membrane dystrophy; ACIOL anterior chamber intraocular lens; IOL intraocular lens; PCIOL posterior chamber intraocular lens; Phaco/IOL phacoemulsification with intraocular lens placement; [place] photorefractive keratectomy; LASIK laser assisted in situ keratomileusis; HTN hypertension; DM diabetes mellitus; COPD chronic obstructive pulmonary disease

## 2020-07-04 NOTE — Assessment & Plan Note (Signed)
This condition has now compensated, with collateralization of vessels on the optic nerve, no active macular edema, residual foveal scarring from massive CME which was present prior to onset of therapy.  No ongoing treatment needed at this time.

## 2020-07-04 NOTE — Assessment & Plan Note (Signed)
I discussed these findings with the patient.  I do recommend follow-up with Dr. Rutherford Guys to consideration of cataract extraction with intraocular lens placement in each eye to maximize visual potential.  I explained to have no impact or worsening effect on the macular of the retina of either eye.  He might consider having cataract surgery in the left eye initially so as to allow for his adjustment to the process and also allow for competence proceeding with cataract surgery in the right eye

## 2020-07-05 DIAGNOSIS — Z79899 Other long term (current) drug therapy: Secondary | ICD-10-CM | POA: Diagnosis not present

## 2020-07-10 ENCOUNTER — Other Ambulatory Visit: Payer: Self-pay

## 2020-07-10 ENCOUNTER — Ambulatory Visit: Payer: HMO | Admitting: Cardiovascular Disease

## 2020-07-10 ENCOUNTER — Encounter: Payer: Self-pay | Admitting: Cardiovascular Disease

## 2020-07-10 VITALS — BP 128/82 | HR 91 | Ht 70.0 in | Wt 252.3 lb

## 2020-07-10 DIAGNOSIS — I4821 Permanent atrial fibrillation: Secondary | ICD-10-CM | POA: Diagnosis not present

## 2020-07-10 DIAGNOSIS — I429 Cardiomyopathy, unspecified: Secondary | ICD-10-CM

## 2020-07-10 NOTE — Patient Instructions (Signed)
Medication Instructions:  Your physician recommends that you continue on your current medications as directed. Please refer to the Current Medication list given to you today.  *If you need a refill on your cardiac medications before your next appointment, please call your pharmacy*   Lab Work: NONE   If you have labs (blood work) drawn today and your tests are completely normal, you will receive your results only by: Marland Kitchen MyChart Message (if you have MyChart) OR . A paper copy in the mail If you have any lab test that is abnormal or we need to change your treatment, we will call you to review the results.   Testing/Procedures: Your physician has requested that you have an echocardiogram. Echocardiography is a painless test that uses sound waves to create images of your heart. It provides your doctor with information about the size and shape of your heart and how well your heart's chambers and valves are working. This procedure takes approximately one hour. There are no restrictions for this procedure.  Follow-Up: At [facility], you and your health needs are our priority.  As part of our continuing mission to provide you with exceptional heart care, we have created designated Provider Care Teams.  These Care Teams include your primary Cardiologist (physician) and Advanced Practice Providers (APPs -  Physician Assistants and Nurse Practitioners) who all work together to provide you with the care you need, when you need it.  We recommend signing up for the patient portal called "MyChart".  Sign up information is provided on this After Visit Summary.  MyChart is used to connect with patients for Virtual Visits (Telemedicine).  Patients are able to view lab/test results, encounter notes, upcoming appointments, etc.  Non-urgent messages can be sent to your provider as well.   To learn more about what you can do with MyChart, go to [email or web address].    Your next appointment:   1  year(s)  The format for your next appointment:   In Person  Provider:   Dr. Johnsie Cancel    Other Instructions Thank you for choosing [facility]!

## 2020-07-15 ENCOUNTER — Emergency Department (HOSPITAL_COMMUNITY): Payer: HMO

## 2020-07-15 ENCOUNTER — Encounter (HOSPITAL_COMMUNITY): Payer: Self-pay | Admitting: *Deleted

## 2020-07-15 ENCOUNTER — Emergency Department (HOSPITAL_COMMUNITY)
Admission: EM | Admit: 2020-07-15 | Discharge: 2020-07-15 | Disposition: A | Discharge status: Home / Self Care | Payer: HMO | Attending: Emergency Medicine | Admitting: Emergency Medicine

## 2020-07-15 DIAGNOSIS — S63114A Dislocation of metacarpophalangeal joint of right thumb, initial encounter: Secondary | ICD-10-CM | POA: Insufficient documentation

## 2020-07-15 DIAGNOSIS — R791 Abnormal coagulation profile: Secondary | ICD-10-CM

## 2020-07-15 DIAGNOSIS — S199XXA Unspecified injury of neck, initial encounter: Secondary | ICD-10-CM | POA: Diagnosis not present

## 2020-07-15 DIAGNOSIS — Z7984 Long term (current) use of oral hypoglycemic drugs: Secondary | ICD-10-CM | POA: Insufficient documentation

## 2020-07-15 DIAGNOSIS — W01198A Fall on same level from slipping, tripping and stumbling with subsequent striking against other object, initial encounter: Secondary | ICD-10-CM | POA: Diagnosis not present

## 2020-07-15 DIAGNOSIS — S63124A Dislocation of unspecified interphalangeal joint of right thumb, initial encounter: Secondary | ICD-10-CM | POA: Diagnosis not present

## 2020-07-15 DIAGNOSIS — E119 Type 2 diabetes mellitus without complications: Secondary | ICD-10-CM | POA: Diagnosis not present

## 2020-07-15 DIAGNOSIS — R220 Localized swelling, mass and lump, head: Secondary | ICD-10-CM | POA: Insufficient documentation

## 2020-07-15 DIAGNOSIS — S63104A Unspecified dislocation of right thumb, initial encounter: Secondary | ICD-10-CM

## 2020-07-15 DIAGNOSIS — R790 Abnormal level of blood mineral: Secondary | ICD-10-CM | POA: Insufficient documentation

## 2020-07-15 DIAGNOSIS — S0511XA Contusion of eyeball and orbital tissues, right eye, initial encounter: Secondary | ICD-10-CM

## 2020-07-15 DIAGNOSIS — Y92009 Unspecified place in unspecified non-institutional (private) residence as the place of occurrence of the external cause: Secondary | ICD-10-CM | POA: Insufficient documentation

## 2020-07-15 DIAGNOSIS — S6991XA Unspecified injury of right wrist, hand and finger(s), initial encounter: Secondary | ICD-10-CM | POA: Diagnosis present

## 2020-07-15 DIAGNOSIS — Z23 Encounter for immunization: Secondary | ICD-10-CM | POA: Diagnosis not present

## 2020-07-15 DIAGNOSIS — Z7901 Long term (current) use of anticoagulants: Secondary | ICD-10-CM | POA: Diagnosis not present

## 2020-07-15 DIAGNOSIS — I4891 Unspecified atrial fibrillation: Secondary | ICD-10-CM | POA: Insufficient documentation

## 2020-07-15 DIAGNOSIS — Z87891 Personal history of nicotine dependence: Secondary | ICD-10-CM | POA: Insufficient documentation

## 2020-07-15 DIAGNOSIS — Z79899 Other long term (current) drug therapy: Secondary | ICD-10-CM | POA: Insufficient documentation

## 2020-07-15 DIAGNOSIS — I10 Essential (primary) hypertension: Secondary | ICD-10-CM | POA: Diagnosis not present

## 2020-07-15 LAB — CBC
HCT: 48.8 % (ref 39.0–52.0)
Hemoglobin: 15.9 g/dL (ref 13.0–17.0)
MCH: 31.4 pg (ref 26.0–34.0)
MCHC: 32.6 g/dL (ref 30.0–36.0)
MCV: 96.3 fL (ref 80.0–100.0)
Platelets: 206 10*3/uL (ref 150–400)
RBC: 5.07 MIL/uL (ref 4.22–5.81)
RDW: 13 % (ref 11.5–15.5)
WBC: 9.7 10*3/uL (ref 4.0–10.5)
nRBC: 0 % (ref 0.0–0.2)

## 2020-07-15 LAB — BASIC METABOLIC PANEL
Anion gap: 8 (ref 5–15)
BUN: 25 mg/dL — ABNORMAL HIGH (ref 8–23)
CO2: 25 mmol/L (ref 22–32)
Calcium: 9.1 mg/dL (ref 8.9–10.3)
Chloride: 106 mmol/L (ref 98–111)
Creatinine, Ser: 1.2 mg/dL (ref 0.61–1.24)
GFR, Estimated: >60 mL/min (ref >60)
Glucose, Bld: 130 mg/dL — ABNORMAL HIGH (ref 70–99)
Potassium: 4.8 mmol/L (ref 3.5–5.1)
Sodium: 139 mmol/L (ref 135–145)

## 2020-07-15 LAB — PROTIME-INR
INR: 3.7 — ABNORMAL HIGH (ref 0.8–1.2)
Prothrombin Time: 37 seconds — ABNORMAL HIGH (ref 11.4–15.2)

## 2020-07-15 MED ORDER — ACETAMINOPHEN 325 MG PO TABS
650.0000 mg | ORAL_TABLET | Freq: Once | ORAL | Status: AC
  Administered 2020-07-15: 650 mg via ORAL
  Filled 2020-07-15: qty 2

## 2020-07-15 MED ORDER — LIDOCAINE HCL (PF) 1 % IJ SOLN
30.0000 mL | Freq: Once | INTRAMUSCULAR | Status: AC
  Administered 2020-07-15: 5 mL
  Filled 2020-07-15: qty 30

## 2020-07-15 MED ORDER — TETANUS-DIPHTH-ACELL PERTUSSIS 5-2.5-18.5 LF-MCG/0.5 IM SUSY
0.5000 mL | PREFILLED_SYRINGE | Freq: Once | INTRAMUSCULAR | Status: AC
  Administered 2020-07-15: 0.5 mL via INTRAMUSCULAR
  Filled 2020-07-15: qty 0.5

## 2020-07-15 NOTE — ED Triage Notes (Signed)
Fell at home, swelling right upper forehead, swelling left thumb, patient is on blood thinner

## 2020-07-15 NOTE — ED Provider Notes (Signed)
[facility] EMERGENCY DEPARTMENT Provider Note   CSN: [id number] Arrival date & time: 07/15/20  1549     History Chief Complaint  Patient presents with  . Fall    [name] is a 75 y.o. male who presents emergency department chief complaint of fall, facial injury and thumb pain.  Patient is on Coumadin for A. fib.  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  Patient states that he slipped on the wet cement, fell forward onto his right hand and hit his right side of his face.  He had immediate severe bruising and swelling around the eye right eye and states that he really does not have much pain in his face but does have a lot of pain in the right thumb when he moves it.  He is right-hand dominant and uses it to hold his cane.  He denies any changes in vision, loss of consciousness, headache, nausea, vomiting, weakness or altered mental status.  HPI     Past Medical History:  Diagnosis Date  . Anxiety   . Arthritis   . Complication of anesthesia    AFTER LUNG SURGERY  . Diabetes mellitus ([place])   . Dysrhythmia 03/2014   HX A-FIB WHEN HAVING COLONOSCOPY  . Hypertension   . Kidney stone   . Obesity   . Polio 1946   RESIDUAL LEFT LEG / FOOT WEAKNESS  . PONV (postoperative nausea and vomiting)   . Sleep apnea    USES C PAP    Patient Active Problem List   Diagnosis Date Noted  . Central retinal vein occlusion, left eye, stable 10/03/2019  . Nuclear sclerotic cataract of both eyes 10/03/2019  . Stable hemispheric central retinal vein occlusion (CRVO) of left eye 07/04/2019  . Posterior vitreous detachment of right eye 07/04/2019  . Nuclear sclerotic cataract of right eye 07/04/2019  . Nuclear sclerotic cataract of left eye 07/04/2019  . Rosacea blepharoconjunctivitis 07/04/2019  . Kidney stone on left side 05/10/2014  . A-fib ([place]) 02/01/2014  . Obesity 02/01/2014  . Diabetes mellitus ([place])   . Hypertension   . Anxiety   . Sleep apnea    . GERD (gastroesophageal reflux disease)   . Atrial fibrillation with RVR ([place])   . Pain in joint, ankle and foot 06/14/2012  . OA (osteoarthritis) of foot 06/14/2012    Past Surgical History:  Procedure Laterality Date  . COLONOSCOPY N/A 02/01/2014   Procedure: COLONOSCOPY;  Surgeon: Rogene Houston, MD;  [facility];  Service: Endoscopy;  Laterality: N/A;  1200  . LUNG SURGERY N/A 2004   "SCRAPPED A FUNGUS OFF MY LUNG"  . NEPHROLITHOTOMY Left 05/10/2014   Procedure: NEPHROLITHOTOMY PERCUTANEOUS;  Surgeon: Jorja Loa, MD;  Location: [facility];  Service: Urology;  Laterality: Left;  Marland Kitchen VASECTOMY         Family History  Problem Relation Age of Onset  . Heart attack Mother   . Brain cancer Father   . Drug abuse Brother     Social History   Tobacco Use  . Smoking status: Former Smoker    Packs/day: 0.50    Years: 10.00    Pack years: 5.00    Types: Cigarettes    Start date: 03/31/1951    Quit date: 11/17/2002    Years since quitting: 17.6  . Smokeless tobacco: Never Used  Vaping Use  . Vaping Use: Never used  Substance Use Topics  . Alcohol  use: Not Currently    Alcohol/week: 0.0 standard drinks    Comment: social  . Drug use: No    Home Medications Prior to Admission medications   Medication Sig Start Date End Date Taking? Authorizing Provider  ALPRAZolam Duanne Moron) 1 MG tablet Take 1 mg by mouth every 6 (six) hours as needed for anxiety.    Yes [provider]  atorvastatin (LIPITOR) 20 MG tablet Take 20 mg by mouth daily. 04/10/19  Yes [provider]  diltiazem (CARDIZEM CD) 180 MG 24 hr capsule TAKE ONE CAPSULE BY MOUTH DAILY. Patient taking differently: Take 180 mg by mouth daily. 06/21/19  Yes Herminio Commons, MD  finasteride (PROSCAR) 5 MG tablet Take 5 mg by mouth daily.   Yes [provider]  levothyroxine (SYNTHROID) 25 MCG tablet Take 25 mcg by mouth daily before breakfast.  01/31/16  Yes [provider]   lisinopril (PRINIVIL,ZESTRIL) 20 MG tablet Take 20 mg by mouth 2 (two) times daily.    Yes [provider]  metFORMIN (GLUCOPHAGE) 500 MG tablet Take 500 mg by mouth 2 (two) times daily with a meal.   Yes [provider]  metoprolol succinate (TOPROL-XL) 50 MG 24 hr tablet TAKE ONE TABLET BY MOUTH TWICE DAILY WITH FOOD OR IMMEDIATELY FOLLOWING A MEAL. Patient taking differently: Take 50 mg by mouth in the morning and at bedtime. 05/22/20  Yes Strader, [place] M, PA-C  warfarin (COUMADIN) 5 MG tablet Take 5 mg by mouth every evening. 04/14/17  Yes [provider]    Allergies    Sulfa antibiotics and Sulfasalazine  Review of Systems   Review of Systems Ten systems reviewed and are negative for acute change, except as noted in the HPI.   Physical Exam Updated Vital Signs BP 134/90   Pulse 92   Temp 97.8 F (36.6 C) (Oral)   Resp 18   SpO2 99%   Physical Exam Vitals and nursing note reviewed.  Constitutional:      General: He is not in acute distress.    Appearance: He is well-developed. He is not diaphoretic.  HENT:     Head: Normocephalic.     Comments: Bruising over the R eye with sig swelling.  EOMI, Vision grossly intact, No photophobia PERRL  Eyes:     General: No scleral icterus.    Conjunctiva/sclera: Conjunctivae normal.  Cardiovascular:     Rate and Rhythm: Normal rate and regular rhythm.     Heart sounds: Normal heart sounds.  Pulmonary:     Effort: Pulmonary effort is normal. No respiratory distress.     Breath sounds: Normal breath sounds.  Abdominal:     Palpations: Abdomen is soft.     Tenderness: There is no abdominal tenderness.  Musculoskeletal:     Cervical back: Normal range of motion and neck supple.     Comments: R thumb with bruising and severe swelling, no obvious deformity, pain at the mcp  Skin:    General: Skin is warm and dry.  Neurological:     Mental Status: He is alert and oriented to person, place, and time.   Psychiatric:        Behavior: Behavior normal.     ED Results / Procedures / Treatments   Labs (all labs ordered are listed, but only abnormal results are displayed) Labs Reviewed - No data to display  EKG None  Radiology No results found.  Procedures Reduction of dislocation  Date/Time: 07/15/2020 8:44 PM Performed by:  Margarita Mail, PA-C Authorized by: Margarita Mail, PA-C  Consent: Verbal consent obtained. Consent given by: patient Patient understanding: patient states understanding of the procedure being performed Site marked: the operative site was marked Patient identity confirmed: verbally with patient and arm band Time out: Immediately prior to procedure a "time out" was called to verify the correct patient, procedure, equipment, support staff and site/side marked as required. Preparation: Patient was prepped and draped in the usual sterile fashion. Local anesthesia used: yes Anesthesia: digital block  Anesthesia: Local anesthesia used: yes Local Anesthetic: lidocaine 1% without epinephrine Patient tolerance: patient tolerated the procedure well with no immediate complications Comments: Patient  Thumb reduced, post reduction films reviewed. Placed in thumb spica splint  .Splint Application  Date/Time: 07/15/2020 8:46 PM Performed by: Margarita Mail, PA-C Authorized by: Margarita Mail, PA-C   Consent:    Consent obtained:  Verbal   Consent given by:  Patient   Risks discussed:  Discoloration, numbness and pain   Alternatives discussed:  No treatment Universal protocol:    Site/side marked: yes     Patient identity confirmed:  Verbally with patient and arm band Pre-procedure details:    Distal neurologic exam:  Normal   Distal perfusion: brisk capillary refill   Procedure details:    Location:  Hand   Hand location:  R hand   Strapping: no     Splint type:  Thumb spica Post-procedure details:    Distal neurologic exam:  Normal   Distal  perfusion: distal pulses strong     Procedure completion:  Tolerated well, no immediate complications   Post-procedure imaging: reviewed      Medications Ordered in ED Medications - No data to display  ED Course  I have reviewed the triage vital signs and the nursing notes.  Pertinent labs & imaging results that were available during my care of the patient were reviewed by me and considered in my medical decision making (see chart for details).  Clinical Course as of 07/15/20 2037  Mon Jul 16, 4171  [id number] 75 year old male on Coumadin here for evaluation of injuries after a fall.  He is got extensive forehead and facial swelling and ecchymosis.  Right thumb dislocation.  Getting x-rays imaging.  Likely discharge if no significant findings. [MB]    Clinical Course User Index [MB] Hayden Rasmussen, MD   MDM Rules/Calculators/A&P                         75 year old male here with mechanical fall.  He has a large right orbital hematoma.  I ordered and reviewed a CT maxillofacial, head and C-spine which show no evidence of acute fracture.  No evidence of globe disruption.  I also ordered a right hand x-ray which showed a dislocation of the right thumb which was reduced.  Patient's labs, blood work and chemistry panel are without significant abnormality, PT/INR shows elevated INR of 3.7.  We will have the patient skipped a dose tonight.  He takes it for A. fib.  Discussed outpatient follow-up, return precautions, supportive home care at bedside with the patient's stepdaughter.  Patient appears otherwise appropriate for discharge at this time       Final Clinical Impression(s) / ED Diagnoses Final diagnoses:  None    Rx / DC Orders ED Discharge Orders    None       Margarita Mail, PA-C 07/15/20 2048    Hayden Rasmussen, MD 07/16/20 1038

## 2020-07-15 NOTE — Discharge Instructions (Addendum)
Please skip your dose of coumadin tonight.  Keep your thumb splint on for the next 4-6 weeks as much as possible  Get help right away if: You have: A severe headache that is not helped by medicine. Trouble walking or weakness in your arms and legs. Clear or bloody fluid coming from your nose or ears. Changes in your vision. A seizure. Increased confusion or irritability. Your symptoms get worse. You are sleepier than normal and have trouble staying awake. You lose your balance. Your pupils change size. Your speech is slurred. Your dizziness gets worse. You vomit.

## 2020-08-08 DIAGNOSIS — Z79899 Other long term (current) drug therapy: Secondary | ICD-10-CM | POA: Diagnosis not present

## 2020-08-27 ENCOUNTER — Other Ambulatory Visit: Payer: Self-pay | Admitting: Student

## 2020-08-27 NOTE — Telephone Encounter (Signed)
This is a [place] pt.  °

## 2020-09-09 DIAGNOSIS — Z79899 Other long term (current) drug therapy: Secondary | ICD-10-CM | POA: Diagnosis not present

## 2020-10-04 ENCOUNTER — Ambulatory Visit (HOSPITAL_COMMUNITY)
Admission: RE | Admit: 2020-10-04 | Discharge: 2020-10-04 | Disposition: A | Discharge status: Home / Self Care | Payer: HMO | Source: Ambulatory Visit | Attending: Internal Medicine | Admitting: Internal Medicine

## 2020-10-04 DIAGNOSIS — I4821 Permanent atrial fibrillation: Secondary | ICD-10-CM

## 2020-10-04 DIAGNOSIS — I429 Cardiomyopathy, unspecified: Secondary | ICD-10-CM

## 2020-10-17 DIAGNOSIS — I4891 Unspecified atrial fibrillation: Secondary | ICD-10-CM | POA: Diagnosis not present

## 2020-12-03 DIAGNOSIS — Z79899 Other long term (current) drug therapy: Secondary | ICD-10-CM | POA: Diagnosis not present

## 2020-12-11 DIAGNOSIS — Z79899 Other long term (current) drug therapy: Secondary | ICD-10-CM | POA: Diagnosis not present

## 2021-01-02 DIAGNOSIS — E6609 Other obesity due to excess calories: Secondary | ICD-10-CM | POA: Diagnosis not present

## 2021-01-02 DIAGNOSIS — Z79899 Other long term (current) drug therapy: Secondary | ICD-10-CM | POA: Diagnosis not present

## 2021-01-02 DIAGNOSIS — Z1389 Encounter for screening for other disorder: Secondary | ICD-10-CM | POA: Diagnosis not present

## 2021-01-02 DIAGNOSIS — Z0000 Encounter for general adult medical examination without abnormal findings: Secondary | ICD-10-CM | POA: Diagnosis not present

## 2021-01-02 DIAGNOSIS — Z6836 Body mass index (BMI) 36.0-36.9, adult: Secondary | ICD-10-CM | POA: Diagnosis not present

## 2021-01-09 DIAGNOSIS — Z79899 Other long term (current) drug therapy: Secondary | ICD-10-CM | POA: Diagnosis not present

## 2021-01-22 ENCOUNTER — Encounter (INDEPENDENT_AMBULATORY_CARE_PROVIDER_SITE_OTHER): Payer: Self-pay | Admitting: *Deleted

## 2021-01-28 DIAGNOSIS — Z79899 Other long term (current) drug therapy: Secondary | ICD-10-CM | POA: Diagnosis not present

## 2021-01-29 DIAGNOSIS — R319 Hematuria, unspecified: Secondary | ICD-10-CM | POA: Diagnosis not present

## 2021-01-29 DIAGNOSIS — Z6836 Body mass index (BMI) 36.0-36.9, adult: Secondary | ICD-10-CM | POA: Diagnosis not present

## 2021-01-29 DIAGNOSIS — I4891 Unspecified atrial fibrillation: Secondary | ICD-10-CM | POA: Diagnosis not present

## 2021-01-29 DIAGNOSIS — Z23 Encounter for immunization: Secondary | ICD-10-CM | POA: Diagnosis not present

## 2021-01-29 DIAGNOSIS — L719 Rosacea, unspecified: Secondary | ICD-10-CM | POA: Diagnosis not present

## 2021-01-29 DIAGNOSIS — E1140 Type 2 diabetes mellitus with diabetic neuropathy, unspecified: Secondary | ICD-10-CM | POA: Diagnosis not present

## 2021-01-29 DIAGNOSIS — E063 Autoimmune thyroiditis: Secondary | ICD-10-CM | POA: Diagnosis not present

## 2021-03-03 DIAGNOSIS — Z79899 Other long term (current) drug therapy: Secondary | ICD-10-CM | POA: Diagnosis not present

## 2021-03-10 DIAGNOSIS — Z79899 Other long term (current) drug therapy: Secondary | ICD-10-CM | POA: Diagnosis not present

## 2021-03-17 DIAGNOSIS — Z79899 Other long term (current) drug therapy: Secondary | ICD-10-CM | POA: Diagnosis not present

## 2021-03-18 ENCOUNTER — Ambulatory Visit: Payer: HMO | Admitting: Urology

## 2021-03-29 ENCOUNTER — Ambulatory Visit
Admission: EM | Admit: 2021-03-29 | Discharge: 2021-03-29 | Disposition: A | Discharge status: Home / Self Care | Payer: HMO | Attending: Family Medicine | Admitting: Family Medicine

## 2021-03-29 ENCOUNTER — Encounter: Payer: Self-pay | Admitting: Emergency Medicine

## 2021-03-29 ENCOUNTER — Other Ambulatory Visit: Payer: Self-pay

## 2021-03-29 DIAGNOSIS — J069 Acute upper respiratory infection, unspecified: Secondary | ICD-10-CM

## 2021-03-29 DIAGNOSIS — Z1152 Encounter for screening for COVID-19: Secondary | ICD-10-CM

## 2021-03-29 DIAGNOSIS — Z20822 Contact with and (suspected) exposure to covid-19: Secondary | ICD-10-CM | POA: Diagnosis not present

## 2021-03-29 NOTE — ED Provider Notes (Signed)
[facility]    CSN: [id number] Arrival date & time: 03/29/21  1058     History   Chief Complaint Chief Complaint  Patient presents with   Cough   Sore Throat    HPI [name] is a 75 y.o. male.   Presenting today with 4 to 5-day history of cough, sore throat that are resolving and he states almost gone.  He denies any fever, chills, body aches, chest pain, shortness of breath, abdominal pain, nausea vomiting or diarrhea.  States daughter who lives with him is positive for COVID and wanted him to get checked out.  Not trying any medications for symptoms.  No history of COPD, asthma.   Past Medical History:  Diagnosis Date   Anxiety    Arthritis    Complication of anesthesia    AFTER LUNG SURGERY   Diabetes mellitus ([place])    Dysrhythmia 03/2014   HX A-FIB WHEN HAVING COLONOSCOPY   Hypertension    Kidney stone    Obesity    Polio 1946   RESIDUAL LEFT LEG / FOOT WEAKNESS   PONV (postoperative nausea and vomiting)    Sleep apnea    USES C PAP    Patient Active Problem List   Diagnosis Date Noted   Central retinal vein occlusion, left eye, stable 10/03/2019   Nuclear sclerotic cataract of both eyes 10/03/2019   Stable hemispheric central retinal vein occlusion (CRVO) of left eye 07/04/2019   Posterior vitreous detachment of right eye 07/04/2019   Nuclear sclerotic cataract of right eye 07/04/2019   Nuclear sclerotic cataract of left eye 07/04/2019   Rosacea blepharoconjunctivitis 07/04/2019   Kidney stone on left side 05/10/2014   A-fib ([place]) 02/01/2014   Obesity 02/01/2014   Diabetes mellitus ([place])    Hypertension    Anxiety    Sleep apnea    GERD (gastroesophageal reflux disease)    Atrial fibrillation with RVR ([facility])    Pain in joint, ankle and foot 06/14/2012   OA (osteoarthritis) of foot 06/14/2012    Past Surgical History:  Procedure Laterality Date   COLONOSCOPY N/A 02/01/2014   Procedure: COLONOSCOPY;  Surgeon: Rogene Houston, MD;   [facility];  Service: Endoscopy;  Laterality: N/A;  1200   LUNG SURGERY N/A 2004   "SCRAPPED A FUNGUS OFF MY LUNG"   NEPHROLITHOTOMY Left 05/10/2014   Procedure: NEPHROLITHOTOMY PERCUTANEOUS;  Surgeon: Jorja Loa, MD;  Location: [facility];  Service: Urology;  Laterality: Left;   VASECTOMY       Home Medications    Prior to Admission medications   Medication Sig Start Date End Date Taking? Authorizing Provider  ALPRAZolam Duanne Moron) 1 MG tablet Take 1 mg by mouth every 6 (six) hours as needed for anxiety.     [provider]  atorvastatin (LIPITOR) 20 MG tablet Take 20 mg by mouth daily. 04/10/19   [provider]  diltiazem (CARDIZEM CD) 180 MG 24 hr capsule TAKE ONE CAPSULE BY MOUTH DAILY. Patient taking differently: Take 180 mg by mouth daily. 06/21/19   Herminio Commons, MD  finasteride (PROSCAR) 5 MG tablet Take 5 mg by mouth daily.    [provider]  levothyroxine (SYNTHROID) 25 MCG tablet Take 25 mcg by mouth daily before breakfast.  01/31/16   [provider]  lisinopril (PRINIVIL,ZESTRIL) 20 MG tablet Take 20 mg by mouth 2 (two) times daily.     [provider]  metFORMIN (GLUCOPHAGE) 500 MG tablet  Take 500 mg by mouth 2 (two) times daily with a meal.    [provider]  metoprolol succinate (TOPROL-XL) 50 MG 24 hr tablet TAKE ONE TABLET BY MOUTH TWICE DAILY WITH FOOD OR IMMEDIATELY FOLLOWING A MEAL. 08/27/20   Strader, Fransisco Hertz, PA-C  warfarin (COUMADIN) 5 MG tablet Take 5 mg by mouth every evening. 04/14/17   [provider]    Family History Family History  Problem Relation Age of Onset   Heart attack Mother    Brain cancer Father    Drug abuse Brother     Social History Social History   Tobacco Use   Smoking status: Former    Packs/day: 0.50    Years: 10.00    Pack years: 5.00    Types: Cigarettes    Start date: 03/31/1951    Quit date: 11/17/2002    Years since quitting: 18.3    Smokeless tobacco: Never  Vaping Use   Vaping Use: Never used  Substance Use Topics   Alcohol use: Not Currently    Alcohol/week: 0.0 standard drinks    Comment: social   Drug use: No     Allergies   Sulfa antibiotics and Sulfasalazine   Review of Systems Review of Systems Per HPI  Physical Exam Triage Vital Signs ED Triage Vitals  Enc Vitals Group     BP 03/29/21 1402 114/86     Pulse Rate 03/29/21 1402 94     Resp 03/29/21 1402 18     Temp 03/29/21 1402 98 F (36.7 C)     Temp Source 03/29/21 1402 Oral     SpO2 03/29/21 1402 97 %     Weight --      Height --      Head Circumference --      Peak Flow --      Pain Score 03/29/21 1400 0     Pain Loc --      Pain Edu? --      Excl. in [place]? --    No data found.  Updated Vital Signs BP 114/86 (BP Location: Right Arm)    Pulse 94    Temp 98 F (36.7 C) (Oral)    Resp 18    SpO2 97%   Visual Acuity Right Eye Distance:   Left Eye Distance:   Bilateral Distance:    Right Eye Near:   Left Eye Near:    Bilateral Near:     Physical Exam Vitals and nursing note reviewed.  Constitutional:      Appearance: He is well-developed.  HENT:     Head: Atraumatic.     Right Ear: External ear normal.     Left Ear: External ear normal.     Nose: Rhinorrhea present.     Mouth/Throat:     Pharynx: Posterior oropharyngeal erythema present. No oropharyngeal exudate.  Eyes:     Conjunctiva/sclera: Conjunctivae normal.     Pupils: Pupils are equal, round, and reactive to light.  Cardiovascular:     Rate and Rhythm: Normal rate and regular rhythm.  Pulmonary:     Effort: Pulmonary effort is normal. No respiratory distress.     Breath sounds: No wheezing or rales.  Abdominal:     General: Bowel sounds are normal. There is no distension.     Palpations: Abdomen is soft.     Tenderness: There is no abdominal tenderness. There is no guarding.  Musculoskeletal:        General: Normal range  of motion.     Cervical back:  Normal range of motion and neck supple.  Lymphadenopathy:     Cervical: No cervical adenopathy.  Skin:    General: Skin is warm and dry.  Neurological:     Mental Status: He is alert and oriented to person, place, and time.  Psychiatric:        Behavior: Behavior normal.   UC Treatments / Results  Labs (all labs ordered are listed, but only abnormal results are displayed) Labs Reviewed  COVID-19, FLU A+B NAA   EKG  Radiology No results found.  Procedures Procedures (including critical care time)  Medications Ordered in UC Medications - No data to display  Initial Impression / Assessment and Plan / UC Course  I have reviewed the triage vital signs and the nursing notes.  Pertinent labs & imaging results that were available during my care of the patient were reviewed by me and considered in my medical decision making (see chart for details).     Vitals and exam reassuring, COVID and flu testing pending, supportive home care reviewed.  He declines prescription medications today.  Final Clinical Impressions(s) / UC Diagnoses   Final diagnoses:  Viral URI with cough  Exposure to COVID-19 virus   Discharge Instructions   None    ED Prescriptions   None    PDMP not reviewed this encounter.   Volney American, [place] 03/29/21 1440

## 2021-03-29 NOTE — ED Triage Notes (Signed)
Pt c/o cough, ST daughter tested positive for Covid

## 2021-03-31 LAB — COVID-19, FLU A+B NAA
Influenza A, NAA: NOT DETECTED
Influenza B, NAA: NOT DETECTED
SARS-CoV-2, NAA: NOT DETECTED

## 2021-04-07 DIAGNOSIS — Z79899 Other long term (current) drug therapy: Secondary | ICD-10-CM | POA: Diagnosis not present

## 2021-04-21 DIAGNOSIS — Z79899 Other long term (current) drug therapy: Secondary | ICD-10-CM | POA: Diagnosis not present

## 2021-05-07 ENCOUNTER — Ambulatory Visit (INDEPENDENT_AMBULATORY_CARE_PROVIDER_SITE_OTHER): Payer: HMO | Admitting: *Deleted

## 2021-05-07 DIAGNOSIS — I4821 Permanent atrial fibrillation: Secondary | ICD-10-CM | POA: Diagnosis not present

## 2021-05-07 DIAGNOSIS — Z5181 Encounter for therapeutic drug level monitoring: Secondary | ICD-10-CM | POA: Diagnosis not present

## 2021-05-07 DIAGNOSIS — I4891 Unspecified atrial fibrillation: Secondary | ICD-10-CM

## 2021-05-07 LAB — POCT INR: INR: 2.5 (ref 2.0–3.0)

## 2021-05-07 MED ORDER — WARFARIN SODIUM 4 MG PO TABS: ORAL_TABLET | ORAL | 3 refills | Status: DC

## 2021-05-07 NOTE — Patient Instructions (Signed)
Use warfarin 4mg  tablet only Start warfarin 1 tablet (4mg ) daily except on Sundays take 1 1/2 tablets (6mg ) Recheck in 2 wks

## 2021-05-21 ENCOUNTER — Ambulatory Visit (INDEPENDENT_AMBULATORY_CARE_PROVIDER_SITE_OTHER): Payer: HMO | Admitting: *Deleted

## 2021-05-21 DIAGNOSIS — Z5181 Encounter for therapeutic drug level monitoring: Secondary | ICD-10-CM | POA: Diagnosis not present

## 2021-05-21 DIAGNOSIS — I4821 Permanent atrial fibrillation: Secondary | ICD-10-CM | POA: Diagnosis not present

## 2021-05-21 LAB — POCT INR: INR: 1.8 — AB (ref 2.0–3.0)

## 2021-05-21 NOTE — Patient Instructions (Signed)
Use warfarin 4mg  tablet only Take warfarin 2 tablets (8mg ) tonight then continue 1 tablet (4mg ) daily except on Sundays take 1 1/2 tablets (6mg ) Recheck in 3 wks

## 2021-06-11 ENCOUNTER — Ambulatory Visit (INDEPENDENT_AMBULATORY_CARE_PROVIDER_SITE_OTHER): Payer: HMO | Admitting: *Deleted

## 2021-06-11 DIAGNOSIS — I4821 Permanent atrial fibrillation: Secondary | ICD-10-CM | POA: Diagnosis not present

## 2021-06-11 DIAGNOSIS — H348122 Central retinal vein occlusion, left eye, stable: Secondary | ICD-10-CM

## 2021-06-11 DIAGNOSIS — Z5181 Encounter for therapeutic drug level monitoring: Secondary | ICD-10-CM | POA: Diagnosis not present

## 2021-06-11 LAB — POCT INR: INR: 1.8 — AB (ref 2.0–3.0)

## 2021-06-11 NOTE — Patient Instructions (Signed)
Use warfarin '4mg'$  tablet only ?Take warfarin 2 tablets ('8mg'$ ) tonight then increase dose to 1 tablet ('4mg'$ ) daily except take 1 1/2 tablets ('6mg'$ ) on Sundays and Wednesdays ?Recheck in 2 wks ?

## 2021-06-26 ENCOUNTER — Ambulatory Visit (INDEPENDENT_AMBULATORY_CARE_PROVIDER_SITE_OTHER): Payer: HMO | Admitting: *Deleted

## 2021-06-26 DIAGNOSIS — Z5181 Encounter for therapeutic drug level monitoring: Secondary | ICD-10-CM | POA: Diagnosis not present

## 2021-06-26 DIAGNOSIS — I4821 Permanent atrial fibrillation: Secondary | ICD-10-CM

## 2021-06-26 LAB — POCT INR: INR: 2 (ref 2.0–3.0)

## 2021-06-26 NOTE — Patient Instructions (Signed)
Use warfarin '4mg'$  tablet only ?Increase warfarin to 1 tablet ('4mg'$ ) daily except take 1 1/2 tablets ('6mg'$ ) on Mondays, Wednesdays and Fridays ?Recheck in 3 wks ?

## 2021-07-03 DIAGNOSIS — F411 Generalized anxiety disorder: Secondary | ICD-10-CM | POA: Diagnosis not present

## 2021-07-03 DIAGNOSIS — N400 Enlarged prostate without lower urinary tract symptoms: Secondary | ICD-10-CM | POA: Diagnosis not present

## 2021-07-03 DIAGNOSIS — E1140 Type 2 diabetes mellitus with diabetic neuropathy, unspecified: Secondary | ICD-10-CM | POA: Diagnosis not present

## 2021-07-03 DIAGNOSIS — I10 Essential (primary) hypertension: Secondary | ICD-10-CM | POA: Diagnosis not present

## 2021-07-03 DIAGNOSIS — Z6834 Body mass index (BMI) 34.0-34.9, adult: Secondary | ICD-10-CM | POA: Diagnosis not present

## 2021-07-03 DIAGNOSIS — I4891 Unspecified atrial fibrillation: Secondary | ICD-10-CM | POA: Diagnosis not present

## 2021-07-08 ENCOUNTER — Telehealth: Payer: Self-pay | Admitting: Cardiovascular Disease

## 2021-07-08 NOTE — Telephone Encounter (Signed)
Patient is requesting to speak with [name] to discuss his coumadin and to go over medications.  ?

## 2021-07-08 NOTE — Telephone Encounter (Signed)
Called and spoke with patient.  States his dog chewed up his warfarin instruction sheet.  Told pt how to continue taking warfarin until INR appt on 07/17/21.  He verbalized understanding. ?

## 2021-07-10 ENCOUNTER — Encounter (INDEPENDENT_AMBULATORY_CARE_PROVIDER_SITE_OTHER): Payer: Self-pay | Admitting: Ophthalmology

## 2021-07-10 ENCOUNTER — Encounter (INDEPENDENT_AMBULATORY_CARE_PROVIDER_SITE_OTHER): Payer: HMO | Admitting: Ophthalmology

## 2021-07-10 ENCOUNTER — Ambulatory Visit (INDEPENDENT_AMBULATORY_CARE_PROVIDER_SITE_OTHER): Payer: HMO | Admitting: Ophthalmology

## 2021-07-10 DIAGNOSIS — H2512 Age-related nuclear cataract, left eye: Secondary | ICD-10-CM

## 2021-07-10 DIAGNOSIS — H34812 Central retinal vein occlusion, left eye, with macular edema: Secondary | ICD-10-CM | POA: Diagnosis not present

## 2021-07-10 DIAGNOSIS — H348122 Central retinal vein occlusion, left eye, stable: Secondary | ICD-10-CM

## 2021-07-10 DIAGNOSIS — H2511 Age-related nuclear cataract, right eye: Secondary | ICD-10-CM | POA: Diagnosis not present

## 2021-07-10 NOTE — Assessment & Plan Note (Signed)
Not impactful on vision ?

## 2021-07-10 NOTE — Progress Notes (Signed)
? ? ?07/10/2021 ? ?  ? ?CHIEF COMPLAINT ?Patient presents for  ?Chief Complaint  ?Patient presents with  ? Retina Follow Up  ? ? ? ? ?HISTORY OF PRESENT ILLNESS: ?[name] is a 76 y.o. male who presents to the clinic today for:  ? ?HPI   ? ? Retina Follow Up   ? ?      ? 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 Laterality: left eye  ? Onset: 1 year ago  ? ?  ?  ? ? Comments   ?1 yr fu OU oct FP. ?Patient states vision is stable and unchanged since last visit. Denies any new floaters or FOL. ?Patient states regular eye doctor is Dr. Gershon Crane and has not seen him in 2 years. ? ?  ?  ?Last edited by Laurin Coder on 07/10/2021 10:23 AM.  ?  ? ? ?Referring physician: ?Rutherford Guys, MD ?[address] ?[place],  [place] [address] ? ?HISTORICAL INFORMATION:  ? ?Selected notes from the [facility] ?  ? ?Lab Results  ?Component Value Date  ? HGBA1C 7.2 (H) 02/01/2014  ?  ? ?CURRENT MEDICATIONS: ?No current outpatient medications on file. (Ophthalmic Drugs)  ? ?No current facility-administered medications for this visit. (Ophthalmic Drugs)  ? ?Current Outpatient Medications (Other)  ?Medication Sig  ? ALPRAZolam (XANAX) 1 MG tablet Take 1 mg by mouth every 6 (six) hours as needed for anxiety.   ? atorvastatin (LIPITOR) 20 MG tablet Take 20 mg by mouth daily.  ? diltiazem (CARDIZEM CD) 180 MG 24 hr capsule TAKE ONE CAPSULE BY MOUTH DAILY. (Patient taking differently: Take 180 mg by mouth daily.)  ? levothyroxine (SYNTHROID) 25 MCG tablet Take 25 mcg by mouth daily before breakfast.   ? lisinopril (PRINIVIL,ZESTRIL) 20 MG tablet Take 20 mg by mouth 2 (two) times daily.   ? metFORMIN (GLUCOPHAGE) 500 MG tablet Take 500 mg by mouth 2 (two) times daily with a meal.  ? metoprolol succinate (TOPROL-XL) 50 MG 24 hr tablet TAKE ONE TABLET BY MOUTH TWICE DAILY WITH FOOD OR IMMEDIATELY FOLLOWING A MEAL.  ? warfarin (COUMADIN) 4 MG tablet Take 1 tablet daily except 1 1/2 tablets on  Sundays  ? ?No current facility-administered medications for this visit. (Other)  ? ? ? ? ?REVIEW OF SYSTEMS: ? ? ? ?ALLERGIES ?Allergies  ?Allergen Reactions  ? Sulfa Antibiotics Rash  ? Sulfasalazine Rash  ? ? ?PAST MEDICAL HISTORY ?Past Medical History:  ?Diagnosis Date  ? Anxiety   ? Arthritis   ? Complication of anesthesia   ? AFTER LUNG SURGERY  ? Diabetes mellitus ([place])   ? Dysrhythmia 03/2014  ? HX A-FIB WHEN HAVING COLONOSCOPY  ? Hypertension   ? Kidney stone   ? Obesity   ? Polio 1946  ? RESIDUAL LEFT LEG / FOOT WEAKNESS  ? PONV (postoperative nausea and vomiting)   ? Sleep apnea   ? USES C PAP  ? ?Past Surgical History:  ?Procedure Laterality Date  ? COLONOSCOPY N/A 02/01/2014  ? Procedure: COLONOSCOPY;  Surgeon: Rogene Houston, MD;  [facility];  Service: Endoscopy;  Laterality: N/A;  1200  ? LUNG SURGERY N/A 2004  ? "SCRAPPED A FUNGUS OFF MY LUNG"  ? NEPHROLITHOTOMY Left 05/10/2014  ? Procedure: NEPHROLITHOTOMY PERCUTANEOUS;  Surgeon: Jorja Loa, MD;  Location: [facility];  Service: Urology;  Laterality: Left;  ? VASECTOMY    ? ? ?FAMILY HISTORY ?Family History  ?  Problem Relation Age of Onset  ? Heart attack Mother   ? Brain cancer Father   ? Drug abuse Brother   ? ? ?SOCIAL HISTORY ?Social History  ? ?Tobacco Use  ? Smoking status: Former  ?  Packs/day: 0.50  ?  Years: 10.00  ?  Pack years: 5.00  ?  Types: Cigarettes  ?  Start date: 03/31/1951  ?  Quit date: 11/17/2002  ?  Years since quitting: 18.6  ? Smokeless tobacco: Never  ?Vaping Use  ? Vaping Use: Never used  ?Substance Use Topics  ? Alcohol use: Not Currently  ?  Alcohol/week: 0.0 standard drinks  ?  Comment: social  ? Drug use: No  ? ?  ? ?  ? ?OPHTHALMIC EXAM: ? ?Base Eye Exam   ? ? Visual Acuity (ETDRS)   ? ?   Right Left  ? Dist [place] 20/25 -2 CF at 3'  ? ?  ?  ? ? Tonometry (Tonopen, 10:28 AM)   ? ?   Right Left  ? Pressure 12 14  ? ?  ?  ? ? Pupils   ? ?   Pupils APD  ? Right PERRL None  ? Left PERRL None  ? ?  ?  ? ? Visual  Fields   ? ?   Left Right  ?   Full  ? Restrictions Total superior nasal deficiency   ? ?  ?  ? ? Extraocular Movement   ? ?   Right Left  ?  Full Full  ? ?  ?  ? ? Neuro/Psych   ? ? Oriented x3: Yes  ? Mood/Affect: Normal  ? ?  ?  ? ? Dilation   ? ? Both eyes: 1.0% Mydriacyl, 2.5% Phenylephrine @ 10:28 AM  ? ?  ?  ? ?  ? ?Slit Lamp and Fundus Exam   ? ? External Exam   ? ?   Right Left  ? External Normal Normal  ? ?  ?  ? ? Slit Lamp Exam   ? ?   Right Left  ? Lids/Lashes Normal Normal  ? Conjunctiva/Sclera White and quiet White and quiet  ? Cornea Clear Clear  ? Anterior Chamber Deep and quiet Deep and quiet  ? Iris Round and reactive Round and reactive  ? Anterior Vitreous Normal Normal  ? ?  ?  ? ? Fundus Exam   ? ?   Right Left  ? Posterior Vitreous Posterior vitreous detachment Posterior vitreous detachment  ? Disc Normal Collaterals on the nerve  ? C/D Ratio 0.45 0.75  ? Macula Normal Disciform scar, no active leakage  ? Vessels Normal  old central retinal vein occlusion, hemicentral inferiorly,, compensated  ? Periphery Normal Normal  ? ?  ?  ? ?  ? ? ?IMAGING AND PROCEDURES  ?Imaging and Procedures for 07/10/21 ? ?OCT, Retina - OU - Both Eyes   ? ?   ?Right Eye ?Quality was good. Scan locations included subfoveal. Central Foveal Thickness: 259. Progression has been stable. Findings include abnormal foveal contour, normal observations.  ? ?Left Eye ?Quality was good. Scan locations included subfoveal. Central Foveal Thickness: 285. Progression has been stable. Findings include no SRF, no IRF, subretinal scarring, disciform scar.  ? ?Notes ?Stable over time, no complications over the last 12 month interval ? ?  ? ?Color Fundus Photography Optos - OU - Both Eyes   ? ?   ?Right Eye ?Progression has been stable. Disc  findings include normal observations. Macula : normal observations. Vessels : normal observations. Periphery : normal observations.  ? ?Left Eye ?Progression has improved. Disc findings include  normal observations. Macula : microaneurysms. Periphery : normal observations.  ? ?Notes ?Old central retinal vein occlusion left eye, now involutional compensated not active.  Foveal disciform scar ? ?  ? ? ?  ?  ? ?  ?ASSESSMENT/PLAN: ? ?Nuclear sclerotic cataract of right eye ?Moderate nuclear sclerotic changes continue OD ? ?Nuclear sclerotic cataract of left eye ?Not impactful on vision ? ?Central retinal vein occlusion, left eye, stable ?Good inferior peripheral PRP no signs of neovascularization, no recurrence of macular edema ? 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  ? ?  ICD-10-CM   ?1. 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  Color Fundus Photography Optos - OU - Both Eyes  ?  ?2. Nuclear sclerotic cataract of right eye  H25.11   ?  ?3. Nuclear sclerotic cataract of left eye  H25.12   ?  ?4. Central retinal vein occlusion, left eye, stable  H34.8122   ?  ?5. Stable hemispheric central retinal vein occlusion (CRVO) of left eye  H34.8122   ?  ? ? ?1.  OU stable, OS with compensated CRVO with subfoveal scarring limiting acuity ? ?2.  Follow-up Dr. Rutherford Guys as scheduled for cataract evaluation ? ?3. ? ?Ophthalmic Meds Ordered this visit:  ?No orders of the defined types were placed in this encounter. ? ? ?  ? ?Return in about 1 year (around 07/11/2022) for DILATE OU, COLOR FP, OCT. ? ?There are no Patient Instructions on file for this visit. ? ? ?Explained the diagnoses, plan, and follow up with the patient and they expressed understanding.  Patient expressed understanding of the importance of proper follow up care.  ? ?Clent Demark. Zalma Channing M.D. ?Diseases & Surgery of the Retina and Vitreous ?[place] ?07/10/21 ? ? ? ? ?Abbreviations: ?M myopia (nearsighted); A astigmatism; H hyperopia (farsighted); P presbyopia; Mrx spectacle prescription;   CTL contact lenses; OD right eye; OS left eye; OU both eyes  XT exotropia; ET esotropia; PEK punctate epithelial keratitis; PEE punctate epithelial erosions; DES dry eye syndrome; MGD meibomian gland dysfunction;

## 2021-07-10 NOTE — Assessment & Plan Note (Signed)
Good inferior peripheral PRP no signs of neovascularization, no recurrence of macular edema ?

## 2021-07-10 NOTE — Assessment & Plan Note (Signed)
No macular edema, subfoveal scarring limits acuity ?

## 2021-07-10 NOTE — Assessment & Plan Note (Signed)
Moderate nuclear sclerotic changes continue OD ?

## 2021-07-17 ENCOUNTER — Ambulatory Visit (INDEPENDENT_AMBULATORY_CARE_PROVIDER_SITE_OTHER): Payer: HMO | Admitting: *Deleted

## 2021-07-17 DIAGNOSIS — I4821 Permanent atrial fibrillation: Secondary | ICD-10-CM

## 2021-07-17 DIAGNOSIS — Z5181 Encounter for therapeutic drug level monitoring: Secondary | ICD-10-CM | POA: Diagnosis not present

## 2021-07-17 LAB — POCT INR: INR: 4.6 — AB (ref 2.0–3.0)

## 2021-07-17 NOTE — Patient Instructions (Signed)
Use warfarin '4mg'$  tablet only ?Hold warfarin tonight, take 1/2 tablet Saturday then continue 1 tablet ('4mg'$ ) daily except take 1 1/2 tablets ('6mg'$ ) on Mondays, Wednesdays and Fridays ?Recheck in 2 wks ?

## 2021-08-04 ENCOUNTER — Ambulatory Visit (INDEPENDENT_AMBULATORY_CARE_PROVIDER_SITE_OTHER): Payer: HMO | Admitting: *Deleted

## 2021-08-04 DIAGNOSIS — H348122 Central retinal vein occlusion, left eye, stable: Secondary | ICD-10-CM | POA: Diagnosis not present

## 2021-08-04 DIAGNOSIS — Z5181 Encounter for therapeutic drug level monitoring: Secondary | ICD-10-CM | POA: Diagnosis not present

## 2021-08-04 DIAGNOSIS — I4821 Permanent atrial fibrillation: Secondary | ICD-10-CM

## 2021-08-04 LAB — POCT INR: INR: 1.7 — AB (ref 2.0–3.0)

## 2021-08-04 NOTE — Patient Instructions (Signed)
Use warfarin '4mg'$  tablet only ?Take warfarin 2 tablets tonight, 1 1/2 tablets Tuesdays night then continue 1 tablet ('4mg'$ ) daily except take 1 1/2 tablets ('6mg'$ ) on Mondays, Wednesdays and Fridays ?Recheck in 2 wks ?

## 2021-08-18 ENCOUNTER — Ambulatory Visit (INDEPENDENT_AMBULATORY_CARE_PROVIDER_SITE_OTHER): Payer: HMO | Admitting: *Deleted

## 2021-08-18 DIAGNOSIS — H348122 Central retinal vein occlusion, left eye, stable: Secondary | ICD-10-CM

## 2021-08-18 DIAGNOSIS — I4821 Permanent atrial fibrillation: Secondary | ICD-10-CM

## 2021-08-18 DIAGNOSIS — Z5181 Encounter for therapeutic drug level monitoring: Secondary | ICD-10-CM

## 2021-08-18 LAB — POCT INR: INR: 4.7 — AB (ref 2.0–3.0)

## 2021-08-18 NOTE — Patient Instructions (Signed)
Use warfarin '4mg'$  tablet only Hold warfarin tonight then decrease dose to 1 tablet ('4mg'$ ) daily except 1 1/2 tablets ('6mg'$ ) on Mondays Recheck in 2 wks

## 2021-08-27 NOTE — Progress Notes (Signed)
The patient was identified using 2 identifiers.  Date:  09/02/2021   ID:  [name], DOB 05/21/1945, MRN [id number]  PCP:  [name] Lenis, MD  Cardiologist:  None New Electrophysiologist:  None   Chief Complaint:  Cardiomyopathy and permanent atrial fibrillation.  History of Present Illness:    [name] is a 76 y.o. male with cardiomyopathy and permanent atrial fibrillation. New To me previously seen by SK He is on statin for HLD, DM-2, hypothyroidism   Echocardiogram on 05/06/2017 demonstrated normalization of left ventricular systolic function, LVEF 55 to 60%, and mild LVH.  Previous LVEF was 40 to 45%.2015  Activity limited by leg weakness and history of polio Uses a cane to ambulate   He is on coumadin for his chronic afib. Prefers this due to cost  Lives with step daughter Been married 4 times and now widowed for a few years Daughter is 68 and comes home late at times Been dating someone for 23 years    Soc Hx: His wife of 34 years, [name], passed away in 07/21/2017. 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  Past Medical History:  Diagnosis Date   Anxiety    Arthritis    Complication of anesthesia    AFTER LUNG SURGERY   Diabetes mellitus ([place])    Dysrhythmia 03/2014   HX A-FIB WHEN HAVING COLONOSCOPY   Hypertension    Kidney stone    Obesity    Polio 1946   RESIDUAL LEFT LEG / FOOT WEAKNESS   PONV (postoperative nausea and vomiting)    Sleep apnea    USES C PAP   Past Surgical History:  Procedure Laterality Date   COLONOSCOPY N/A 02/01/2014   Procedure: COLONOSCOPY;  Surgeon: Rogene Houston, MD;  [facility];  Service: Endoscopy;  Laterality: N/A;  1200   LUNG SURGERY N/A 2004   "SCRAPPED A FUNGUS OFF MY LUNG"   NEPHROLITHOTOMY Left 05/10/2014   Procedure: NEPHROLITHOTOMY PERCUTANEOUS;  Surgeon: Jorja Loa, MD;  Location[facility];  Service: Urology;  Laterality: Left;   VASECTOMY       Current Meds  Medication Sig    ALPRAZolam (XANAX) 1 MG tablet Take 1 mg by mouth every 6 (six) hours as needed for anxiety.    atorvastatin (LIPITOR) 20 MG tablet Take 20 mg by mouth daily.   diltiazem (CARDIZEM CD) 180 MG 24 hr capsule TAKE ONE CAPSULE BY MOUTH DAILY. (Patient taking differently: Take 180 mg by mouth daily.)   levothyroxine (SYNTHROID) 25 MCG tablet Take 25 mcg by mouth daily before breakfast.    lisinopril (PRINIVIL,ZESTRIL) 20 MG tablet Take 20 mg by mouth 2 (two) times daily.    metFORMIN (GLUCOPHAGE) 500 MG tablet Take 500 mg by mouth 2 (two) times daily with a meal.   metoprolol succinate (TOPROL-XL) 50 MG 24 hr tablet TAKE ONE TABLET BY MOUTH TWICE DAILY WITH FOOD OR IMMEDIATELY FOLLOWING A MEAL.   warfarin (COUMADIN) 4 MG tablet Take 1 tablet daily except 1 1/2 tablets on Sundays     Allergies:   Sulfa antibiotics and Sulfasalazine   Social History   Tobacco Use   Smoking status: Former    Packs/day: 0.50    Years: 10.00    Pack years: 5.00    Types: Cigarettes    Start date: 03/31/1951    Quit date: 11/17/2002    Years since quitting: 18.8   Smokeless tobacco: Never  Vaping Use  Vaping Use: Never used  Substance Use Topics   Alcohol use: Not Currently    Alcohol/week: 0.0 standard drinks    Comment: social   Drug use: No     Family Hx: The patient's family history includes Brain cancer in his father; Drug abuse in his brother; Heart attack in his mother.  ROS:   Please see the history of present illness.     All other systems reviewed and are negative.   Prior CV studies:   The following studies were reviewed today:  Reviewed above  Labs/Other Tests and Data Reviewed:    EKG:  Afib rate 90 nonspecific ST changes 09/02/2021   Recent Labs: No results found for requested labs within last 8760 hours.   Recent Lipid Panel No results found for: CHOL, TRIG, HDL, CHOLHDL, LDLCALC, LDLDIRECT  Wt Readings from Last 3 Encounters:  09/02/21 241 lb 6.4 oz (109.5 kg)  07/10/20 252  lb 4.8 oz (114.4 kg)  06/15/19 244 lb (110.7 kg)     Objective:    Vital Signs:  BP 136/84   Pulse 90   Ht '5\' 10"'$  (1.778 m)   Wt 241 lb 6.4 oz (109.5 kg)   SpO2 96%   BMI 34.64 kg/m    Affect appropriate Healthy:  appears stated age HEENT: bulbous deformity of nose  Neck supple with no adenopathy JVP normal no bruits no thyromegaly Lungs clear with no wheezing and good diaphragmatic motion Heart:  S1/S2 no murmur, no rub, gallop or click PMI normal Abdomen: benighn, BS positve, no tenderness, no AAA no bruit.  No HSM or HJR Distal pulses intact with no bruits No edema Neuro non-focal Skin warm and dry LE muscle weakness polio    ASSESSMENT & PLAN:    1.  Cardiomyopathy: Etiology never defined no stress testing, CT or cath ever done. ? Rate Related from afib EF normalized by TTE 2019 TTE ordered 2022 never done will update    2.  Permanent atrial fibrillation: Heart rate controlled on Toprol-XL and Cardizem CD 180 mg daily.  Anticoagulated with warfarin.  Discussed DOAC with him but he prefers coumadin    3.  Hypertension: Controlled on present therapy.  No changes.   4.  Sleep apnea: Uses CPAP nightly.  5. HLD continue statin labs with primary   6. Hypothyroidism:  Continue synthroid TSH with primary   7. DM:  Discussed low carb diet.  Target hemoglobin A1c is 6.5 or less.  Continue current medications.     Medication Adjustments/Labs and Tests Ordered: Current medicines are reviewed at length with the patient today.  Concerns regarding medicines are outlined above.   Tests Ordered: Orders Placed This Encounter  Procedures   EKG 12-Lead   Echo for DCM    Medication Changes: No orders of the defined types were placed in this encounter.   Follow Up:  In Person in 1 year(s)  Signed, Jenkins Rouge, MD  09/02/2021 10:36 AM    [place]

## 2021-09-01 ENCOUNTER — Ambulatory Visit (INDEPENDENT_AMBULATORY_CARE_PROVIDER_SITE_OTHER): Payer: HMO | Admitting: *Deleted

## 2021-09-01 DIAGNOSIS — I4821 Permanent atrial fibrillation: Secondary | ICD-10-CM

## 2021-09-01 DIAGNOSIS — H348122 Central retinal vein occlusion, left eye, stable: Secondary | ICD-10-CM

## 2021-09-01 DIAGNOSIS — Z5181 Encounter for therapeutic drug level monitoring: Secondary | ICD-10-CM

## 2021-09-01 LAB — POCT INR: INR: 2.9 (ref 2.0–3.0)

## 2021-09-01 NOTE — Patient Instructions (Signed)
Use warfarin '4mg'$  tablet only Continue warfarin 1 tablet ('4mg'$ ) daily except 1 1/2 tablets ('6mg'$ ) on Mondays Recheck in 3 wks

## 2021-09-02 ENCOUNTER — Encounter: Payer: Self-pay | Admitting: Cardiovascular Disease

## 2021-09-02 ENCOUNTER — Other Ambulatory Visit: Payer: Self-pay | Admitting: Student

## 2021-09-02 ENCOUNTER — Ambulatory Visit: Payer: HMO | Admitting: Cardiovascular Disease

## 2021-09-02 VITALS — BP 136/84 | HR 90 | Ht 70.0 in | Wt 241.4 lb

## 2021-09-02 DIAGNOSIS — I429 Cardiomyopathy, unspecified: Secondary | ICD-10-CM

## 2021-09-02 DIAGNOSIS — Z5181 Encounter for therapeutic drug level monitoring: Secondary | ICD-10-CM

## 2021-09-02 DIAGNOSIS — I4821 Permanent atrial fibrillation: Secondary | ICD-10-CM | POA: Diagnosis not present

## 2021-09-02 NOTE — Patient Instructions (Signed)
Medication Instructions:  Your physician recommends that you continue on your current medications as directed. Please refer to the Current Medication list given to you today.  *If you need a refill on your cardiac medications before your next appointment, please call your pharmacy*   Lab Work: NONE   If you have labs (blood work) drawn today and your tests are completely normal, you will receive your results only by: MyChart Message (if you have MyChart) OR A paper copy in the mail If you have any lab test that is abnormal or we need to change your treatment, we will call you to review the results.   Testing/Procedures: Your physician has requested that you have an echocardiogram. Echocardiography is a painless test that uses sound waves to create images of your heart. It provides your doctor with information about the size and shape of your heart and how well your heart's chambers and valves are working. This procedure takes approximately one hour. There are no restrictions for this procedure.    Follow-Up: At [facility], you and your health needs are our priority.  As part of our continuing mission to provide you with exceptional heart care, we have created designated Provider Care Teams.  These Care Teams include your primary Cardiologist (physician) and Advanced Practice Providers (APPs -  Physician Assistants and Nurse Practitioners) who all work together to provide you with the care you need, when you need it.  We recommend signing up for the patient portal called "MyChart".  Sign up information is provided on this After Visit Summary.  MyChart is used to connect with patients for Virtual Visits (Telemedicine).  Patients are able to view lab/test results, encounter notes, upcoming appointments, etc.  Non-urgent messages can be sent to your provider as well.   To learn more about what you can do with MyChart, go to [email or web address].    Your next appointment:   1  year(s)  The format for your next appointment:   In Person  Provider:   Peter Nishan, MD    Other Instructions Thank you for choosing [facility]!     Important Information About Sugar       

## 2021-09-12 ENCOUNTER — Ambulatory Visit (HOSPITAL_COMMUNITY)
Admission: RE | Admit: 2021-09-12 | Discharge: 2021-09-12 | Disposition: A | Discharge status: Home / Self Care | Payer: HMO | Source: Ambulatory Visit | Attending: Cardiovascular Disease | Admitting: Cardiovascular Disease

## 2021-09-12 DIAGNOSIS — I4821 Permanent atrial fibrillation: Secondary | ICD-10-CM | POA: Diagnosis not present

## 2021-09-12 LAB — ECHOCARDIOGRAM COMPLETE
AR max vel: 2.53 cm2
AV Area VTI: 2.56 cm2
AV Area mean vel: 2.26 cm2
AV Mean grad: 1.7 mmHg
AV Peak grad: 3.2 mmHg
Ao pk vel: 0.89 m/s
Area-P 1/2: 5.09 cm2
MV VTI: 1.67 cm2
S' Lateral: 3 cm

## 2021-09-12 NOTE — Progress Notes (Signed)
*  PRELIMINARY RESULTS* Echocardiogram 2D Echocardiogram has been performed.  [name] 09/12/2021, 2:02 PM

## 2021-09-15 ENCOUNTER — Telehealth: Payer: Self-pay | Admitting: Cardiovascular Disease

## 2021-09-15 NOTE — Telephone Encounter (Signed)
Follow Up:    Patient wants to know if his Echo results from Friday are ready please?

## 2021-09-15 NOTE — Telephone Encounter (Signed)
Patient notified that provider has not resulted echo report.

## 2021-09-16 DIAGNOSIS — E6609 Other obesity due to excess calories: Secondary | ICD-10-CM | POA: Diagnosis not present

## 2021-09-16 DIAGNOSIS — J069 Acute upper respiratory infection, unspecified: Secondary | ICD-10-CM | POA: Diagnosis not present

## 2021-09-16 DIAGNOSIS — Z6834 Body mass index (BMI) 34.0-34.9, adult: Secondary | ICD-10-CM | POA: Diagnosis not present

## 2021-09-18 ENCOUNTER — Other Ambulatory Visit (HOSPITAL_COMMUNITY): Payer: Self-pay

## 2021-09-19 ENCOUNTER — Telehealth: Payer: Self-pay | Admitting: Cardiovascular Disease

## 2021-09-19 NOTE — Telephone Encounter (Signed)
Patient is calling about his results.

## 2021-09-22 ENCOUNTER — Telehealth: Payer: Self-pay | Admitting: Cardiovascular Disease

## 2021-10-02 ENCOUNTER — Ambulatory Visit (INDEPENDENT_AMBULATORY_CARE_PROVIDER_SITE_OTHER): Payer: HMO | Admitting: *Deleted

## 2021-10-02 DIAGNOSIS — I4821 Permanent atrial fibrillation: Secondary | ICD-10-CM

## 2021-10-02 DIAGNOSIS — Z5181 Encounter for therapeutic drug level monitoring: Secondary | ICD-10-CM

## 2021-10-02 LAB — POCT INR: INR: 2.7 (ref 2.0–3.0)

## 2021-10-02 NOTE — Patient Instructions (Signed)
Use warfarin '4mg'$  tablet only Continue warfarin 1 tablet ('4mg'$ ) daily except 1 1/2 tablets ('6mg'$ ) on Mondays Recheck in 4 wks

## 2021-10-30 ENCOUNTER — Ambulatory Visit (INDEPENDENT_AMBULATORY_CARE_PROVIDER_SITE_OTHER): Payer: HMO | Admitting: *Deleted

## 2021-10-30 DIAGNOSIS — I4891 Unspecified atrial fibrillation: Secondary | ICD-10-CM | POA: Diagnosis not present

## 2021-10-30 DIAGNOSIS — I4821 Permanent atrial fibrillation: Secondary | ICD-10-CM | POA: Diagnosis not present

## 2021-10-30 DIAGNOSIS — Z5181 Encounter for therapeutic drug level monitoring: Secondary | ICD-10-CM

## 2021-10-30 LAB — POCT INR: INR: 2.4 (ref 2.0–3.0)

## 2021-10-30 NOTE — Patient Instructions (Signed)
Use warfarin '4mg'$  tablet only Continue warfarin 1 tablet ('4mg'$ ) daily except 1 1/2 tablets ('6mg'$ ) on Mondays Recheck in 4 wks

## 2021-11-27 ENCOUNTER — Ambulatory Visit: Payer: HMO | Attending: Cardiovascular Disease | Admitting: *Deleted

## 2021-11-27 DIAGNOSIS — Z5181 Encounter for therapeutic drug level monitoring: Secondary | ICD-10-CM

## 2021-11-27 DIAGNOSIS — I4821 Permanent atrial fibrillation: Secondary | ICD-10-CM | POA: Diagnosis not present

## 2021-11-27 LAB — POCT INR: INR: 2.4 (ref 2.0–3.0)

## 2021-11-27 NOTE — Patient Instructions (Signed)
Use warfarin '4mg'$  tablet only Continue warfarin 1 tablet ('4mg'$ ) daily except 1 1/2 tablets ('6mg'$ ) on Mondays Recheck in 4 wks

## 2021-11-30 IMAGING — DX DG HAND COMPLETE 3+V*R*
3 series · 3 of 3 positions shown · non-contrast
Comparison: Exam at 1023 hours compared to 9318 hours

CLINICAL DATA: Post reduction

EXAM:
RIGHT HAND - COMPLETE 3+ VIEW

[hand ap]
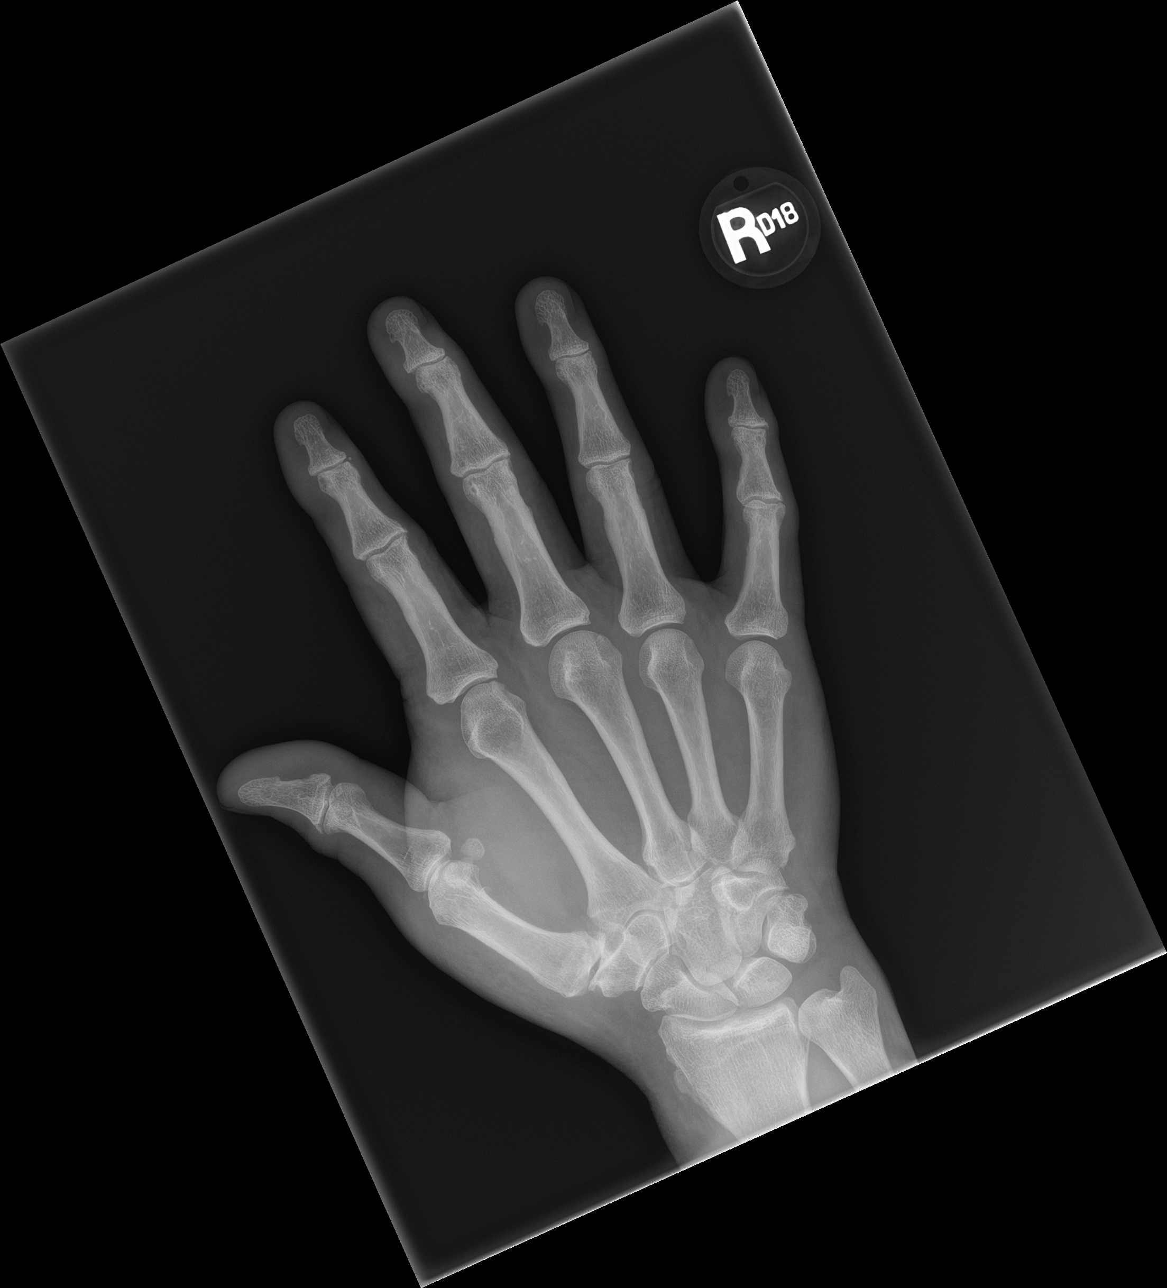

[hand obl]
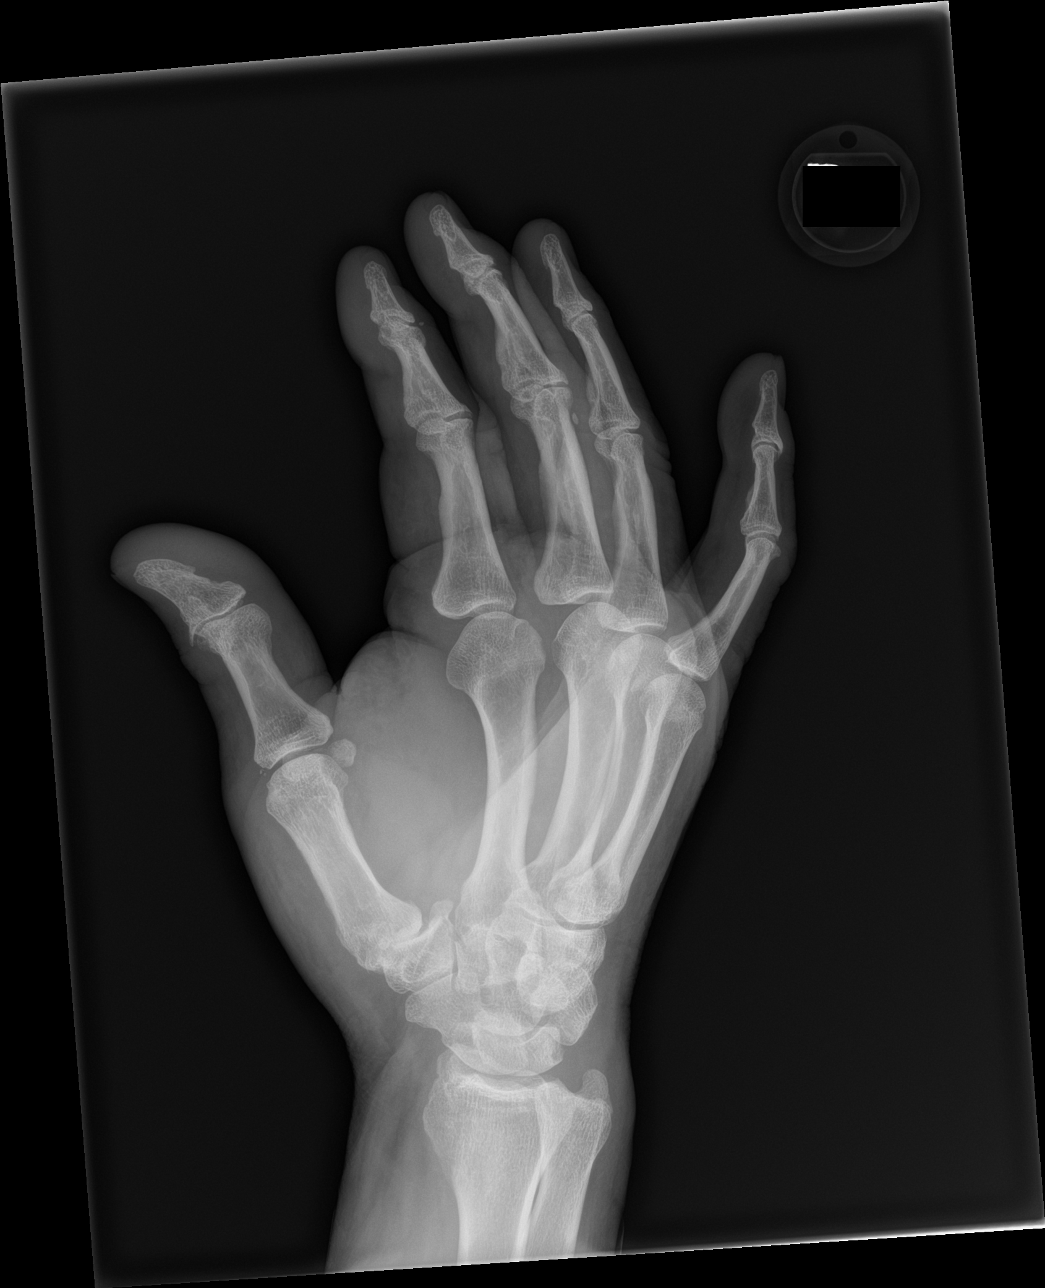

[hand lat]
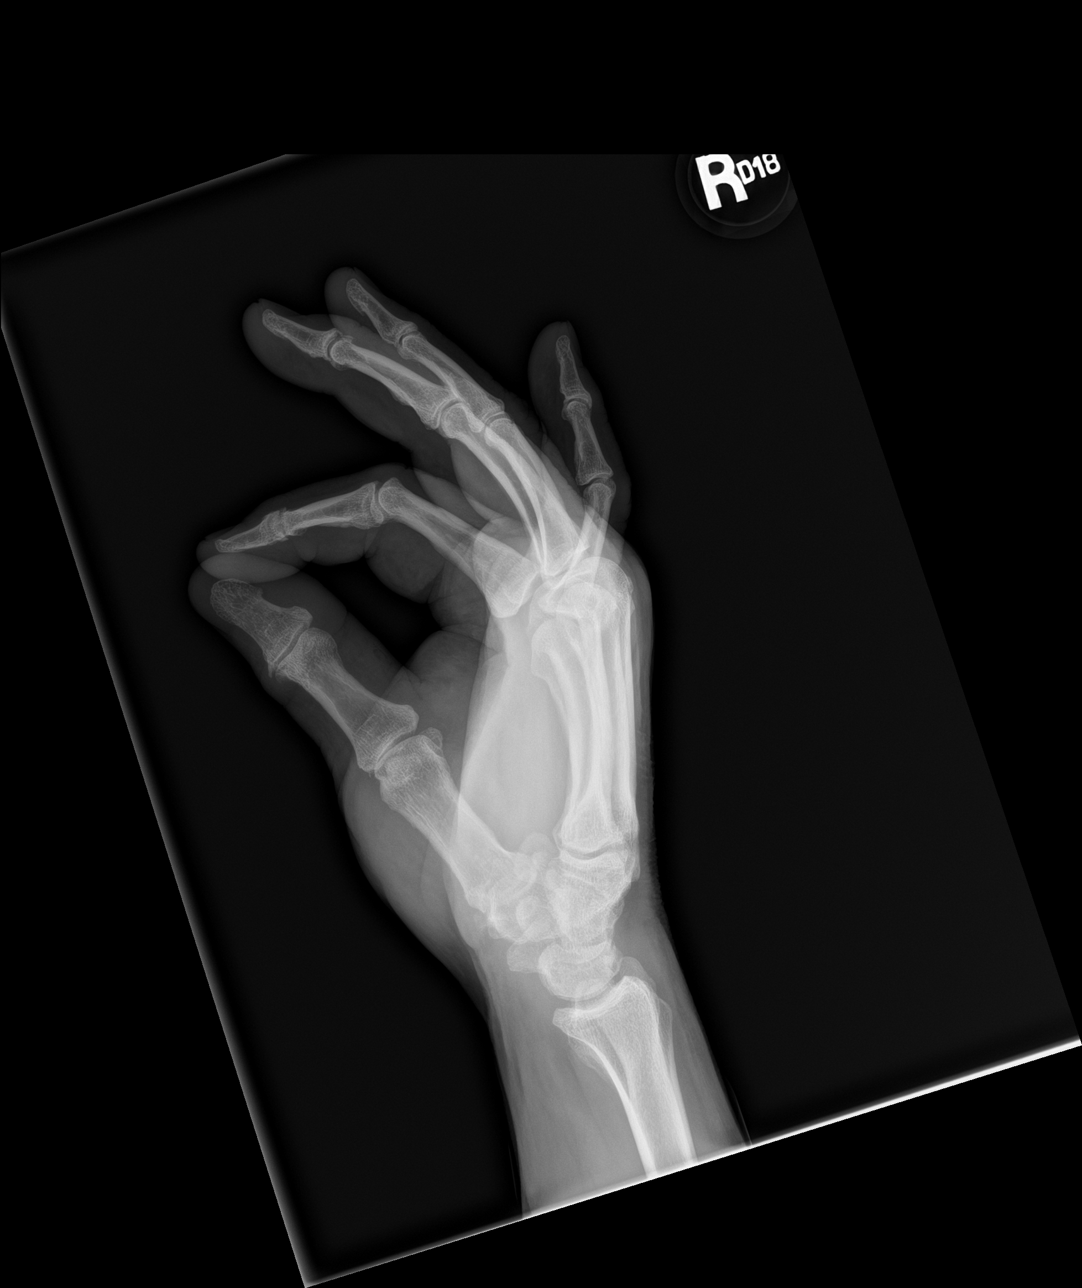

[3 of 3 positions shown; findings below may reference images not displayed]

FINDINGS: Previously identified dislocation of the first MCP joint appears
reduced.

Tiny calcific densities are seen at the ulnar margin of the distal
first metacarpal and at the radial margin of the first MCP joint,
question tiny avulsion fragments.

No additional fracture or dislocation.

Regional soft tissue swelling noted.
IMPRESSION: Reduction of previously identified dislocation of RIGHT first MCP
joint.

Tiny probable avulsion fragments at MCP joint and adjacent to distal
first metacarpal.

## 2021-11-30 IMAGING — DX DG HAND COMPLETE 3+V*R*
3 series · 3 of 3 positions shown · non-contrast
Comparison: None

CLINICAL DATA: Fall today, pain at thumb and first metacarpal

EXAM:
RIGHT HAND - COMPLETE 3+ VIEW

[hand ap]
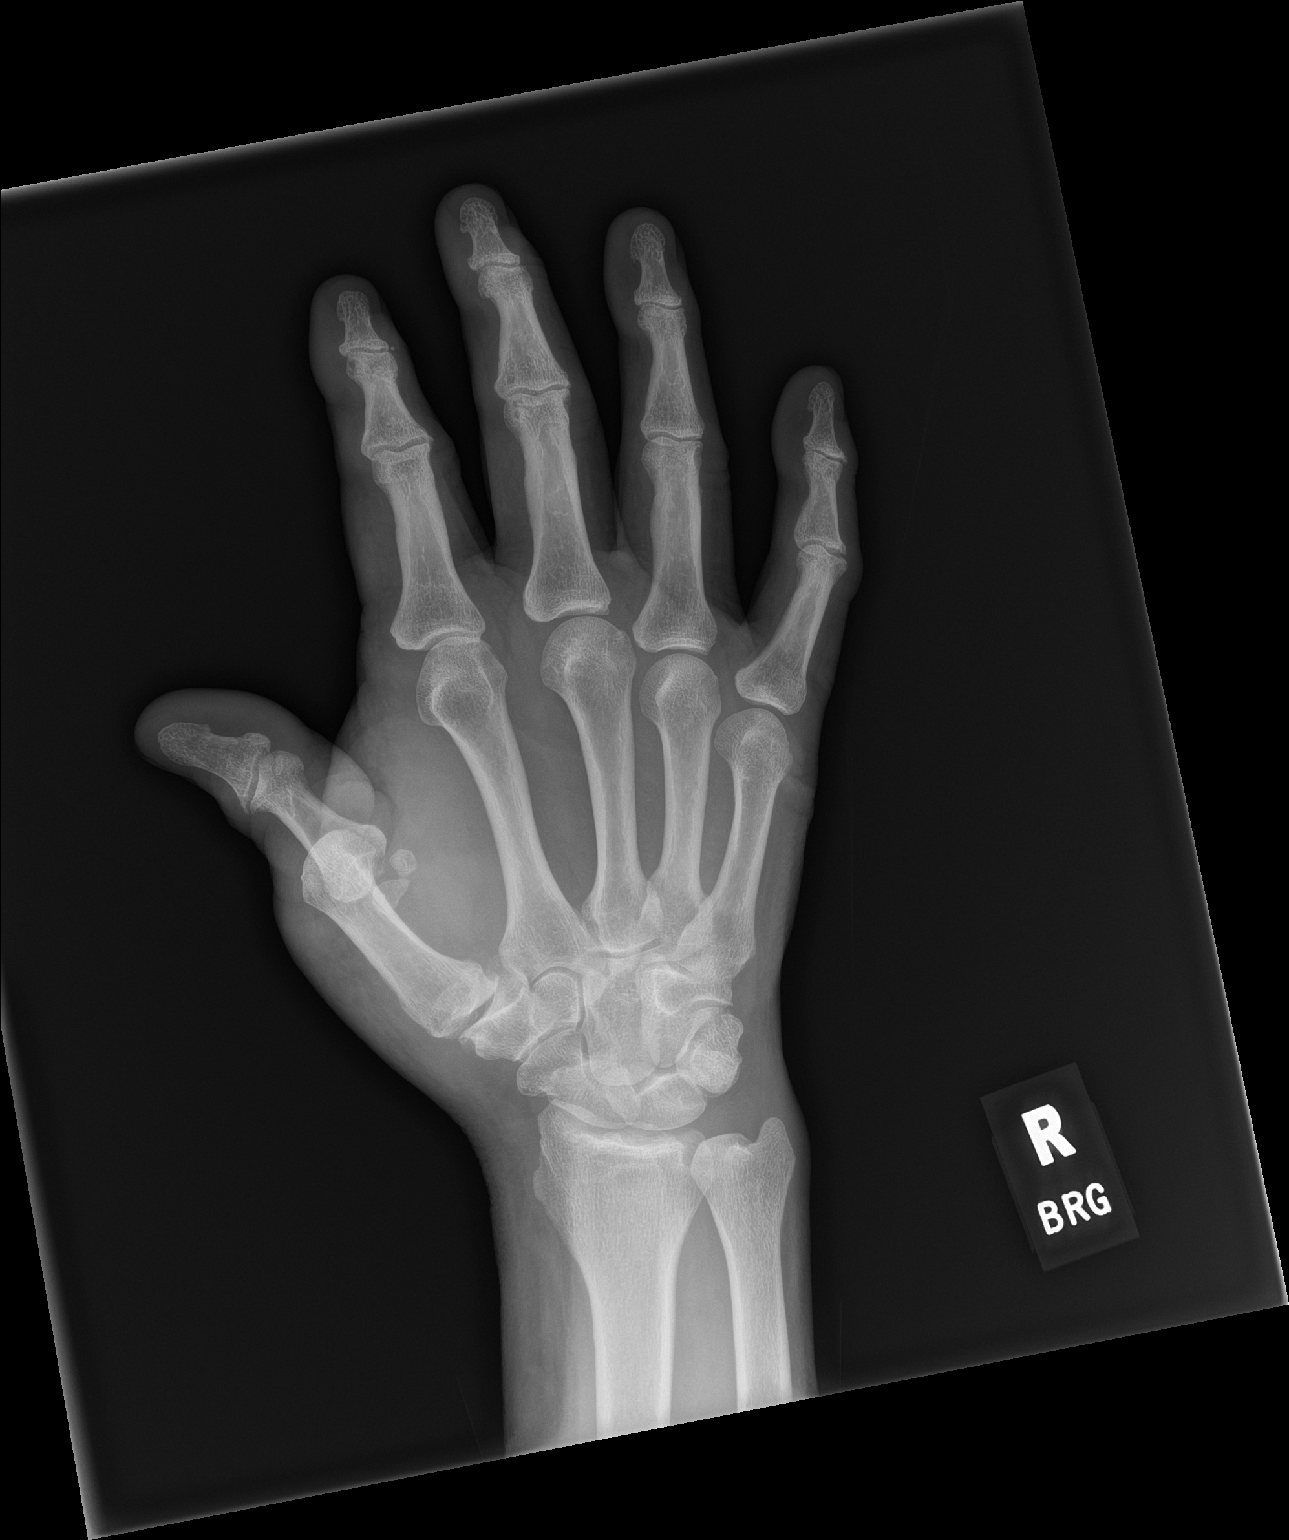

[hand obl]
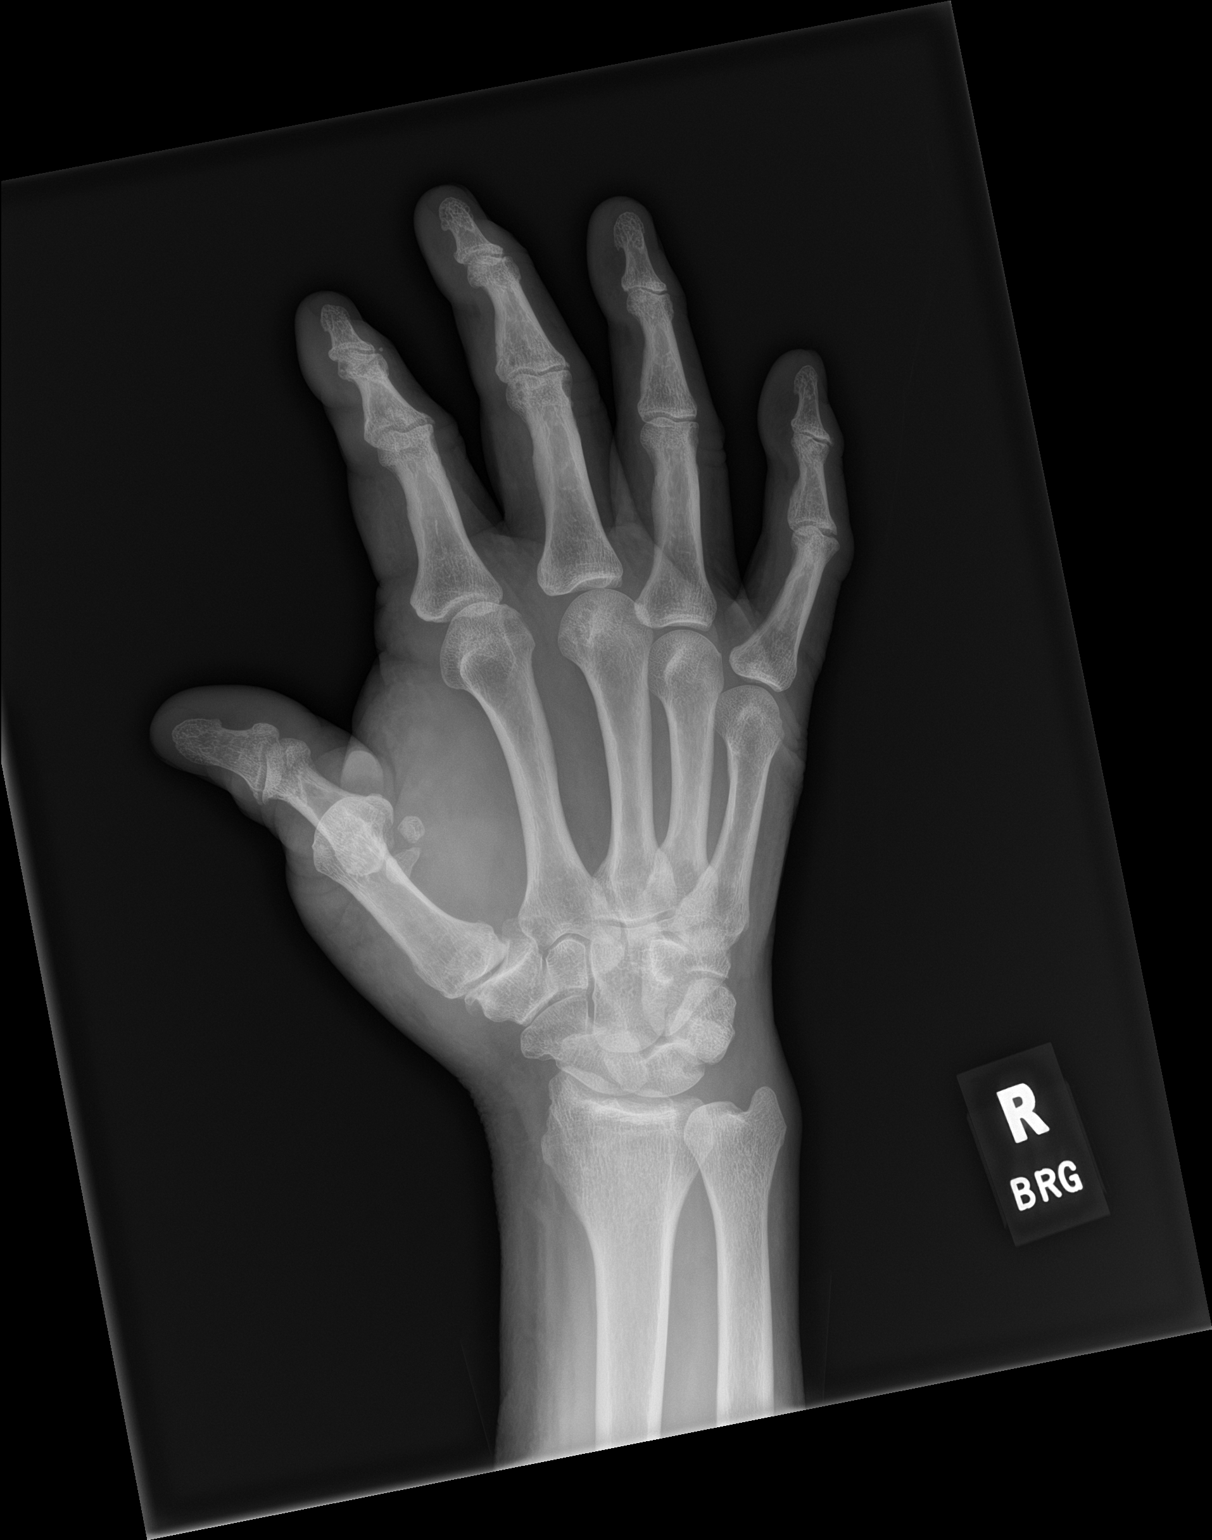

[hand lat]
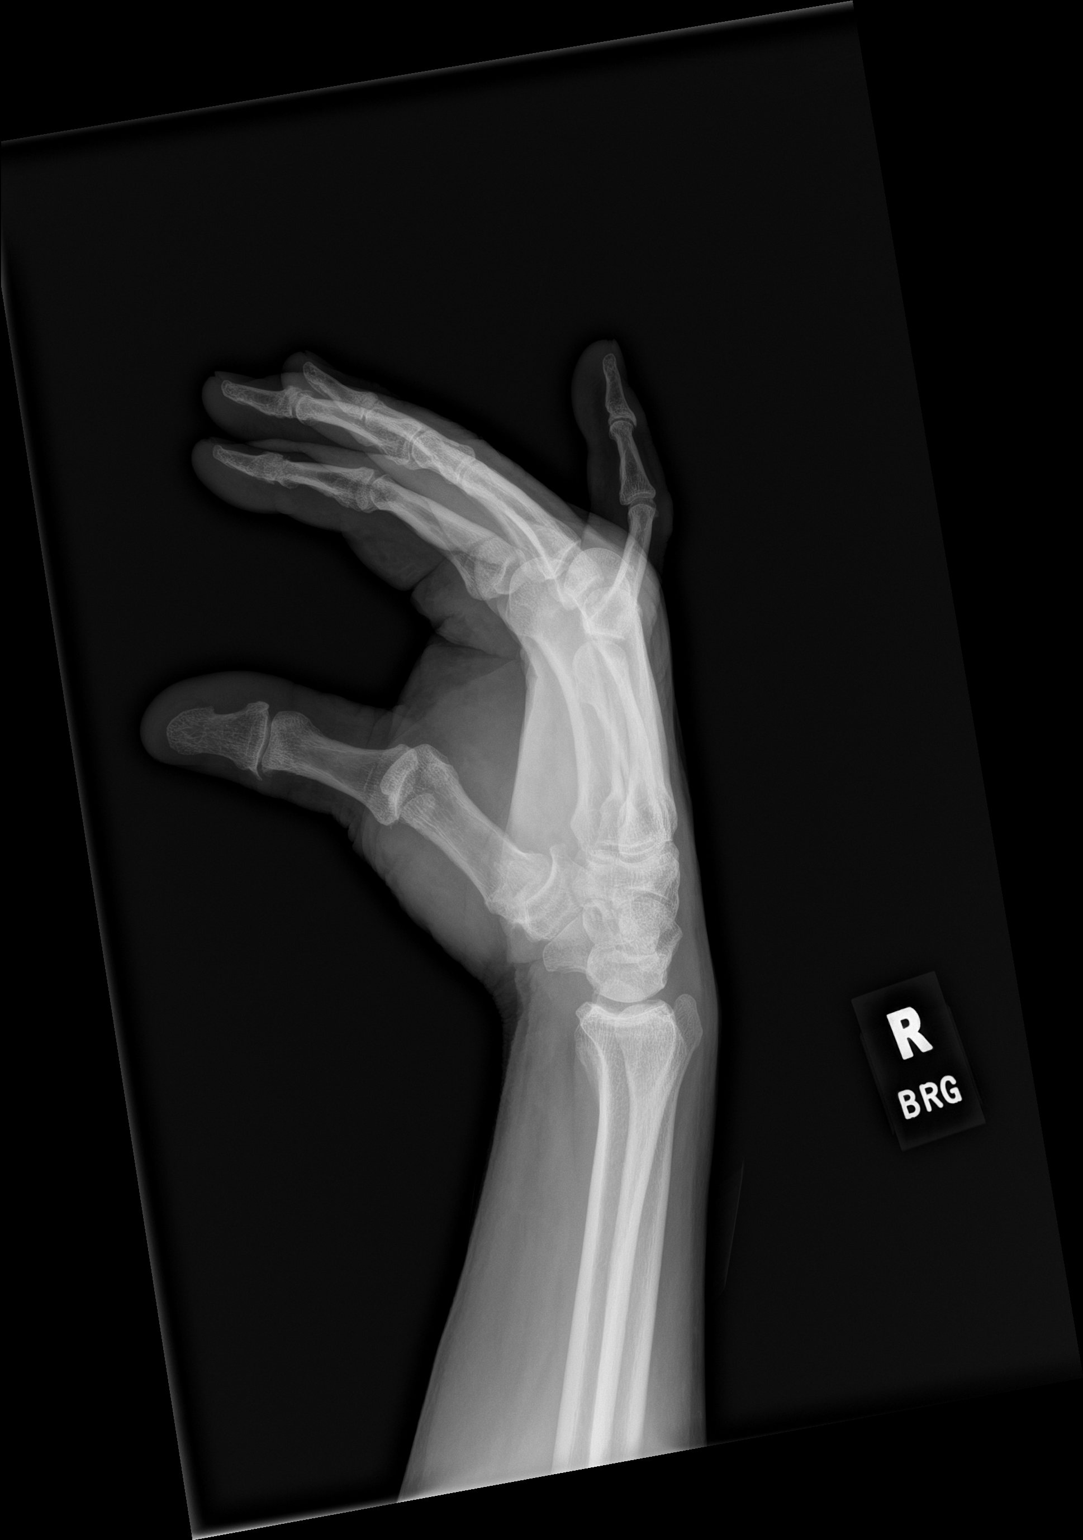

[3 of 3 positions shown; findings below may reference images not displayed]

FINDINGS: Osseous mineralization normal.

Dislocation of first MCP joint.

Overlying soft tissue swelling.

Degenerative changes first CMC joint.

Remaining joint spaces preserved.

No fracture, additional dislocation, or bone destruction.
IMPRESSION: Dislocation of first MCP joint.

Degenerative changes first CMC joint.

## 2021-12-26 ENCOUNTER — Ambulatory Visit: Payer: HMO | Attending: Cardiovascular Disease | Admitting: *Deleted

## 2021-12-26 DIAGNOSIS — I4821 Permanent atrial fibrillation: Secondary | ICD-10-CM | POA: Diagnosis not present

## 2021-12-26 DIAGNOSIS — Z5181 Encounter for therapeutic drug level monitoring: Secondary | ICD-10-CM | POA: Diagnosis not present

## 2021-12-26 DIAGNOSIS — I4891 Unspecified atrial fibrillation: Secondary | ICD-10-CM | POA: Diagnosis not present

## 2021-12-26 LAB — POCT INR: INR: 2.8 (ref 2.0–3.0)

## 2021-12-26 NOTE — Patient Instructions (Signed)
Use warfarin '4mg'$  tablet only Continue warfarin 1 tablet ('4mg'$ ) daily except 1 1/2 tablets ('6mg'$ ) on Mondays Recheck in 6 wks

## 2022-01-18 ENCOUNTER — Other Ambulatory Visit: Payer: Self-pay | Admitting: Cardiovascular Disease

## 2022-02-02 ENCOUNTER — Telehealth: Payer: Self-pay

## 2022-02-02 NOTE — Telephone Encounter (Signed)
Called and LVM asking for patient to please return my call. Patient is coming up on reports for the office but has not been seen here. Need to find out who current PCP is.   OK for PEC to speak with patient and find out who the patient's PCP is.

## 2022-02-02 NOTE — Telephone Encounter (Signed)
Called and LVM asking for patient to please return my call. Patient is coming up on reports for our office but has never been seen here. Need to fond out who current PCP is.   OK for PEC to speak to the patient and find out who his current PCP is if he calls back.

## 2022-02-05 ENCOUNTER — Ambulatory Visit: Payer: HMO | Attending: Cardiovascular Disease | Admitting: *Deleted

## 2022-02-05 DIAGNOSIS — Z5181 Encounter for therapeutic drug level monitoring: Secondary | ICD-10-CM

## 2022-02-05 DIAGNOSIS — I4821 Permanent atrial fibrillation: Secondary | ICD-10-CM

## 2022-02-05 LAB — POCT INR: INR: 4.1 — AB (ref 2.0–3.0)

## 2022-02-05 NOTE — Patient Instructions (Signed)
Use warfarin '4mg'$  tablet only Hold warfarin tonight then continue 1 tablet ('4mg'$ ) daily except 1 1/2 tablets ('6mg'$ ) on Mondays Eat extra greens today Recheck in 3 wks

## 2022-02-09 NOTE — Telephone Encounter (Signed)
Called and LVM asking for patient to please return my call.   OK for PEC to speak to patient if he calls back.

## 2022-02-26 ENCOUNTER — Ambulatory Visit: Payer: HMO | Attending: Cardiovascular Disease | Admitting: *Deleted

## 2022-02-26 DIAGNOSIS — I4821 Permanent atrial fibrillation: Secondary | ICD-10-CM

## 2022-02-26 DIAGNOSIS — Z5181 Encounter for therapeutic drug level monitoring: Secondary | ICD-10-CM | POA: Diagnosis not present

## 2022-02-26 LAB — POCT INR: INR: 2.4 (ref 2.0–3.0)

## 2022-02-26 NOTE — Patient Instructions (Signed)
Use warfarin '4mg'$  tablet only Continue warfarin 1 tablet ('4mg'$ ) daily except 1 1/2 tablets ('6mg'$ ) on Mondays Continue greens Recheck in 4 wks

## 2022-03-26 ENCOUNTER — Ambulatory Visit: Payer: HMO | Attending: Cardiovascular Disease | Admitting: *Deleted

## 2022-03-26 DIAGNOSIS — I4821 Permanent atrial fibrillation: Secondary | ICD-10-CM | POA: Diagnosis not present

## 2022-03-26 DIAGNOSIS — Z5181 Encounter for therapeutic drug level monitoring: Secondary | ICD-10-CM | POA: Diagnosis not present

## 2022-03-26 LAB — POCT INR: INR: 3.3 — AB (ref 2.0–3.0)

## 2022-03-26 NOTE — Patient Instructions (Signed)
Use warfarin '4mg'$  tablet only Hold warfarin tomorrow morning the continue warfarin 1 tablet ('4mg'$ ) daily except 1 1/2 tablets ('6mg'$ ) on Mondays Continue greens Recheck in 4 wks

## 2022-04-23 ENCOUNTER — Ambulatory Visit (HOSPITAL_COMMUNITY)
Admission: RE | Admit: 2022-04-23 | Discharge: 2022-04-23 | Disposition: A | Discharge status: Home / Self Care | Payer: HMO | Source: Ambulatory Visit | Attending: Internal Medicine | Admitting: Internal Medicine

## 2022-04-23 ENCOUNTER — Other Ambulatory Visit (HOSPITAL_COMMUNITY): Payer: Self-pay | Admitting: Internal Medicine

## 2022-04-23 DIAGNOSIS — R051 Acute cough: Secondary | ICD-10-CM

## 2022-04-23 DIAGNOSIS — I10 Essential (primary) hypertension: Secondary | ICD-10-CM | POA: Diagnosis not present

## 2022-04-23 DIAGNOSIS — E6609 Other obesity due to excess calories: Secondary | ICD-10-CM | POA: Diagnosis not present

## 2022-04-23 DIAGNOSIS — S0990XA Unspecified injury of head, initial encounter: Secondary | ICD-10-CM | POA: Diagnosis not present

## 2022-04-23 DIAGNOSIS — S40921A Unspecified superficial injury of right upper arm, initial encounter: Secondary | ICD-10-CM | POA: Diagnosis not present

## 2022-04-23 DIAGNOSIS — E1129 Type 2 diabetes mellitus with other diabetic kidney complication: Secondary | ICD-10-CM | POA: Diagnosis not present

## 2022-04-23 DIAGNOSIS — R059 Cough, unspecified: Secondary | ICD-10-CM | POA: Diagnosis not present

## 2022-04-23 DIAGNOSIS — Z0001 Encounter for general adult medical examination with abnormal findings: Secondary | ICD-10-CM | POA: Diagnosis not present

## 2022-04-23 DIAGNOSIS — R5383 Other fatigue: Secondary | ICD-10-CM | POA: Diagnosis not present

## 2022-04-23 DIAGNOSIS — E1165 Type 2 diabetes mellitus with hyperglycemia: Secondary | ICD-10-CM | POA: Diagnosis not present

## 2022-04-23 DIAGNOSIS — E1140 Type 2 diabetes mellitus with diabetic neuropathy, unspecified: Secondary | ICD-10-CM | POA: Diagnosis not present

## 2022-04-23 DIAGNOSIS — N183 Chronic kidney disease, stage 3 unspecified: Secondary | ICD-10-CM | POA: Diagnosis not present

## 2022-04-23 DIAGNOSIS — E559 Vitamin D deficiency, unspecified: Secondary | ICD-10-CM | POA: Diagnosis not present

## 2022-04-23 DIAGNOSIS — I4891 Unspecified atrial fibrillation: Secondary | ICD-10-CM | POA: Diagnosis not present

## 2022-04-23 DIAGNOSIS — Z6833 Body mass index (BMI) 33.0-33.9, adult: Secondary | ICD-10-CM | POA: Diagnosis not present

## 2022-05-04 ENCOUNTER — Telehealth: Payer: Self-pay | Admitting: Cardiovascular Disease

## 2022-05-04 NOTE — Telephone Encounter (Signed)
Called pt.  No answer.  LM I would call back later.

## 2022-05-04 NOTE — Telephone Encounter (Signed)
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 He would like to speak with Lattie Haw about it. He says his phone sometimes messes up.

## 2022-05-04 NOTE — Telephone Encounter (Signed)
Spoke with pt.  Wanted to let me know he had been sick with flu-like symptoms and had to cancel last INR appt.  Is having some kidney issues and had to get blood work today.  I rescheduled INR appt for 2.14.24.  He appreciated call back.

## 2022-05-05 DIAGNOSIS — Z79899 Other long term (current) drug therapy: Secondary | ICD-10-CM | POA: Diagnosis not present

## 2022-05-05 DIAGNOSIS — R7989 Other specified abnormal findings of blood chemistry: Secondary | ICD-10-CM | POA: Diagnosis not present

## 2022-05-11 ENCOUNTER — Other Ambulatory Visit (HOSPITAL_COMMUNITY): Payer: Self-pay | Admitting: Internal Medicine

## 2022-05-11 DIAGNOSIS — D72829 Elevated white blood cell count, unspecified: Secondary | ICD-10-CM | POA: Diagnosis not present

## 2022-05-11 DIAGNOSIS — R4182 Altered mental status, unspecified: Secondary | ICD-10-CM

## 2022-05-11 DIAGNOSIS — E1165 Type 2 diabetes mellitus with hyperglycemia: Secondary | ICD-10-CM | POA: Diagnosis not present

## 2022-05-11 DIAGNOSIS — Z7901 Long term (current) use of anticoagulants: Secondary | ICD-10-CM | POA: Diagnosis not present

## 2022-05-11 DIAGNOSIS — Z6832 Body mass index (BMI) 32.0-32.9, adult: Secondary | ICD-10-CM | POA: Diagnosis not present

## 2022-05-11 DIAGNOSIS — E6609 Other obesity due to excess calories: Secondary | ICD-10-CM | POA: Diagnosis not present

## 2022-05-11 DIAGNOSIS — R7989 Other specified abnormal findings of blood chemistry: Secondary | ICD-10-CM | POA: Diagnosis not present

## 2022-05-11 DIAGNOSIS — F0390 Unspecified dementia without behavioral disturbance: Secondary | ICD-10-CM | POA: Diagnosis not present

## 2022-05-11 DIAGNOSIS — E1129 Type 2 diabetes mellitus with other diabetic kidney complication: Secondary | ICD-10-CM | POA: Diagnosis not present

## 2022-05-14 ENCOUNTER — Other Ambulatory Visit (INDEPENDENT_AMBULATORY_CARE_PROVIDER_SITE_OTHER): Payer: HMO

## 2022-05-14 ENCOUNTER — Ambulatory Visit: Payer: HMO | Admitting: Physician Assistant

## 2022-05-14 ENCOUNTER — Encounter: Payer: Self-pay | Admitting: Physician Assistant

## 2022-05-14 VITALS — BP 140/84 | HR 94 | Resp 20 | Ht 70.0 in | Wt 222.0 lb

## 2022-05-14 DIAGNOSIS — R413 Other amnesia: Secondary | ICD-10-CM | POA: Diagnosis not present

## 2022-05-14 LAB — VITAMIN B12: Vitamin B-12: 218 pg/mL (ref 211–911)

## 2022-05-14 LAB — TSH: TSH: 3.61 u[IU]/mL (ref 0.35–5.50)

## 2022-05-14 NOTE — Patient Instructions (Addendum)
It was a pleasure to see you today at our office.   Recommendations:  MRI brain   Check labs today Resume CPAP   Management of hypercalcemia as per PCP (dehydration)  Continue taking donepezil but increase to 10  mg daily, follow with PCP  Recommend hearing testing  Folllow up  in 3 months  Whom to call:  Memory  decline, memory medications: Call our office [phone]   For psychiatric meds, mood meds: Please have your primary care physician manage these medications.    For assessment of decision of mental capacity and competency:  Call Dr. Anthoney Harada, geriatric psychiatrist at [phone]     If you have any severe symptoms of a stroke, or other severe issues such as confusion,severe chills or fever, etc call 911 or go to the ER as you may need to be evaluated further   Feel free to visit Facebook page " [email or web address]" for tips of how to care for people with memory problems.        RECOMMENDATIONS FOR ALL PATIENTS WITH MEMORY PROBLEMS: 1. Continue to exercise (Recommend 30 minutes of walking everyday, or 3 hours every week) 2. Increase social interactions - continue going to [place] and enjoy social gatherings with friends and family 3. Eat healthy, avoid fried foods and eat more fruits and vegetables 4. Maintain adequate blood pressure, blood sugar, and blood cholesterol level. Reducing the risk of stroke and cardiovascular disease also helps promoting better memory. 5. Avoid stressful situations. Live a simple life and avoid aggravations. Organize your time and prepare for the next day in anticipation. 6. Sleep well, avoid any interruptions of sleep and avoid any distractions in the bedroom that may interfere with adequate sleep quality 7. Avoid sugar, avoid sweets as there is a strong link between excessive sugar intake, diabetes, and cognitive impairment We discussed the Mediterranean diet, which has been shown to help patients reduce the risk of progressive memory  disorders and reduces cardiovascular risk. This includes eating fish, eat fruits and green leafy vegetables, nuts like almonds and hazelnuts, walnuts, and also use olive oil. Avoid fast foods and fried foods as much as possible. Avoid sweets and sugar as sugar use has been linked to worsening of memory function.  There is always a concern of gradual progression of memory problems. If this is the case, then we may need to adjust level of care according to patient needs. Support, both to the patient and caregiver, should then be put into place.      You have been referred for a neuropsychological evaluation (i.e., evaluation of memory and thinking abilities). Please bring someone with you to this appointment if possible, as it is helpful for the doctor to hear from both you and another adult who knows you well. Please bring eyeglasses and hearing aids if you wear them.    The evaluation will take approximately 3 hours and has two parts:   The first part is a clinical interview with the neuropsychologist (Dr. Melvyn Novas or Dr. Nicole Kindred). During the interview, the neuropsychologist will speak with you and the individual you brought to the appointment.    The second part of the evaluation is testing with the doctor's technician Hinton Dyer or Maudie Mercury). During the testing, the technician will ask you to remember different types of material, solve problems, and answer some questionnaires. Your family member will not be present for this portion of the evaluation.   Please note: We must reserve several hours of the neuropsychologist's time and  the psychometrician's time for your evaluation appointment. As such, there is a No-Show fee of $100. If you are unable to attend any of your appointments, please contact our office as soon as possible to reschedule.    FALL PRECAUTIONS: Be cautious when walking. Scan the area for obstacles that may increase the risk of trips and falls. When getting up in the mornings, sit up at the  edge of the bed for a few minutes before getting out of bed. Consider elevating the bed at the head end to avoid drop of blood pressure when getting up. Walk always in a well-lit room (use night lights in the walls). Avoid area rugs or power cords from appliances in the middle of the walkways. Use a walker or a cane if necessary and consider physical therapy for balance exercise. Get your eyesight checked regularly.  FINANCIAL OVERSIGHT: Supervision, especially oversight when making financial decisions or transactions is also recommended.  HOME SAFETY: Consider the safety of the kitchen when operating appliances like stoves, microwave oven, and blender. Consider having supervision and share cooking responsibilities until no longer able to participate in those. Accidents with firearms and other hazards in the house should be identified and addressed as well.   ABILITY TO BE LEFT ALONE: If patient is unable to contact 911 operator, consider using LifeLine, or when the need is there, arrange for someone to stay with patients. Smoking is a fire hazard, consider supervision or cessation. Risk of wandering should be assessed by caregiver and if detected at any point, supervision and safe proof recommendations should be instituted.  MEDICATION SUPERVISION: Inability to self-administer medication needs to be constantly addressed. Implement a mechanism to ensure safe administration of the medications.   DRIVING: Regarding driving, in patients with progressive memory problems, driving will be impaired. We advise to have someone else do the driving if trouble finding directions or if minor accidents are reported. Independent driving assessment is available to determine safety of driving.   If you are interested in the driving assessment, you can contact the following:  [employer] in [place]  [place] [phone] or [phone]    [place] refers to food and lifestyle choices that are based on the traditions of countries located on the [employer]. This way of eating has been shown to help prevent certain conditions and improve outcomes for people who have chronic diseases, like kidney disease and heart disease. What are tips for following this plan? Lifestyle  Cook and eat meals together with your family, when possible. Drink enough fluid to keep your urine clear or pale yellow. Be physically active every day. This includes: Aerobic exercise like running or swimming. Leisure activities like gardening, walking, or housework. Get 7-8 hours of sleep each night. If recommended by your health care provider, drink red wine in moderation. This means 1 glass a day for nonpregnant women and 2 glasses a day for men. A glass of wine equals 5 oz (150 mL). Reading food labels  Check the serving size of packaged foods. For foods such as rice and pasta, the serving size refers to the amount of cooked product, not dry. Check the total fat in packaged foods. Avoid foods that have saturated fat or trans fats. Check the ingredients list for added sugars, such as corn syrup. Shopping  At the grocery store, buy most of your food from the areas near the walls  of the store. This includes: Fresh fruits and vegetables (produce). Grains, beans, nuts, and seeds. Some of these may be available in unpackaged forms or large amounts (in bulk). Fresh seafood. Poultry and eggs. Low-fat dairy products. Buy whole ingredients instead of prepackaged foods. Buy fresh fruits and vegetables in-season from local farmers markets. Buy frozen fruits and vegetables in resealable bags. If you do not have access to quality fresh seafood, buy precooked frozen shrimp or canned fish, such as tuna, salmon, or sardines. Buy small amounts of raw or cooked  vegetables, salads, or olives from the deli or salad bar at your store. Stock your pantry so you always have certain foods on hand, such as olive oil, canned tuna, canned tomatoes, rice, pasta, and beans. Cooking  Cook foods with extra-virgin olive oil instead of using butter or other vegetable oils. Have meat as a side dish, and have vegetables or grains as your main dish. This means having meat in small portions or adding small amounts of meat to foods like pasta or stew. Use beans or vegetables instead of meat in common dishes like chili or lasagna. Experiment with different cooking methods. Try roasting or broiling vegetables instead of steaming or sauteing them. Add frozen vegetables to soups, stews, pasta, or rice. Add nuts or seeds for added healthy fat at each meal. You can add these to yogurt, salads, or vegetable dishes. Marinate fish or vegetables using olive oil, lemon juice, garlic, and fresh herbs. Meal planning  Plan to eat 1 vegetarian meal one day each week. Try to work up to 2 vegetarian meals, if possible. Eat seafood 2 or more times a week. Have healthy snacks readily available, such as: Vegetable sticks with hummus. Greek yogurt. Fruit and nut trail mix. Eat balanced meals throughout the week. This includes: Fruit: 2-3 servings a day Vegetables: 4-5 servings a day Low-fat dairy: 2 servings a day Fish, poultry, or lean meat: 1 serving a day Beans and legumes: 2 or more servings a week Nuts and seeds: 1-2 servings a day Whole grains: 6-8 servings a day Extra-virgin olive oil: 3-4 servings a day Limit red meat and sweets to only a few servings a month What are my food choices? Mediterranean diet Recommended Grains: Whole-grain pasta. Brown rice. Bulgar wheat. Polenta. Couscous. Whole-wheat bread. [place]. Vegetables: Artichokes. Beets. Broccoli. Cabbage. Carrots. Eggplant. Green beans. Chard. Kale. Spinach. Onions. Leeks. Peas. Squash. Tomatoes. Peppers.  Radishes. Fruits: Apples. Apricots. Avocado. Berries. Bananas. Cherries. Dates. Figs. Grapes. Lemons. Melon. Oranges. Peaches. Plums. Pomegranate. Meats and other protein foods: Beans. Almonds. Sunflower seeds. Pine nuts. Peanuts. [place]. Salmon. Scallops. Shrimp. [place]. Tilapia. Clams. Oysters. Eggs. Dairy: Low-fat milk. Cheese. Greek yogurt. Beverages: Water. Red wine. Herbal tea. Fats and oils: Extra virgin olive oil. Avocado oil. Grape seed oil. Sweets and desserts: [place] yogurt with honey. Baked apples. Poached pears. Trail mix. Seasoning and other foods: Basil. Cilantro. Coriander. Cumin. Mint. Parsley. Sage. Rosemary. Tarragon. Garlic. Oregano. Thyme. Pepper. Balsalmic vinegar. Tahini. Hummus. Tomato sauce. Olives. Mushrooms. Limit these Grains: Prepackaged pasta or rice dishes. Prepackaged cereal with added sugar. Vegetables: Deep fried potatoes (french fries). Fruits: Fruit canned in syrup. Meats and other protein foods: Beef. Pork. Lamb. Poultry with skin. Hot dogs. [place] Salines. Dairy: Ice cream. Sour cream. Whole milk. Beverages: Juice. Sugar-sweetened soft drinks. Beer. Liquor and spirits. Fats and oils: Butter. Canola oil. Vegetable oil. Beef fat (tallow). Lard. Sweets and desserts: Cookies. Cakes. Pies. Candy. Seasoning and other foods: Mayonnaise. Premade sauces and marinades. The items listed  may not be a complete list. Talk with your dietitian about what dietary choices are right for you. Summary The Mediterranean diet includes both food and lifestyle choices. Eat a variety of fresh fruits and vegetables, beans, nuts, seeds, and whole grains. Limit the amount of red meat and sweets that you eat. Talk with your health care provider about whether it is safe for you to drink red wine in moderation. This means 1 glass a day for nonpregnant women and 2 glasses a day for men. A glass of wine equals 5 oz (150 mL). This information is not intended to replace advice given to you by your health  care provider. Make sure you discuss any questions you have with your health care provider. Document Released: 11/07/2015 Document Revised: 12/10/2015 Document Reviewed: 11/07/2015 Elsevier Interactive Patient Education  2017 [place] [phone]

## 2022-05-14 NOTE — Progress Notes (Signed)
Assessment/Plan:    The patient is seen in neurologic consultation at the request of Redmond School, MD for the evaluation of memory.  [name] is a very pleasant 77 y.o. year old RH male with  a history of hypertension, hyperlipidemia, CKD, OSA not CPAP, HOH (does not want hearing aids)  permanent atrial fibrillation on Coumadin, hypothyroidism, increased glucocorticoid levels, recent hypercalcemia likely due to due to dehydration, DM 2 seen today for evaluation of memory loss. MoCA today is 19/30. He is on donepezil 5 mg nightly. Workup is in progress.   Memory Impairment of multiple etiologies   MRI brain without contrast to assess for underlying structural abnormality and assess vascular load  Check B12, TSH Resume CPAP use for OSA  Management of hypercalcemia as per PCP (dehydration)  Continue taking donepezil but increase to 10  mg daily, follow with PCP  Increase water intake in view of hypercalcemia and reactive leukocytosis Recommend good control of cardiovascular risk factors.  Continue Coumadin as per cardiology Continue to control mood as per PCP Folllow up in 3 months after having addressed possible reversible causes of memory loss (hearing, CPAP, metabolic issues, increase in activity), at which time we will reassess.  Subjective:    The patient is accompanied by his stepdaughter  who supplements the history.    How long did patient have memory difficulties? Over the last 2-3 years, worse over the last few weeks, when he noticed  difficulty remembering recent conversations, new information, and people names that he just met, "but ok with people that he knows" . Stepdaughter noticed this weekend a big difference when he did not recognize her. Does not do crossword puzzles or word finding. Enjoys watching TV   repeats oneself?  Endorsed Disoriented when walking into a room?  Patient denies  Leaving objects in unusual places?  Occasionally losing stuff but not in  unusual places    Wandering behavior? Denies, stepdaughter agrees    Any personality changes since last visit?  Endorsed, he is more anxious than before. Any depression? 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 Also, has not been socializing with his friends as before and that affected him Hallucinations or paranoia?  Only when he thought his step-daughter was someone else recently.  Seizures?   denies    Any sleep changes?   He was napping more often, " and the doctor told him it was depression-stepdaughter says. "He is doing a little better lately. Has vivid dreams over the last 2 weeks, denies REM behavior or sleepwalking   Sleep apnea?  Endorsed, uses CPAP nightly Any hygiene concerns?  denies   Independent of bathing and dressing?  Endorsed  Does the patient need help with medications?  Daughter is in charge, after there were discrepancies on the doses of the medication and what he was ingesting  Who is in charge of the finances?  Patient is in charge     Any changes in appetite? Decreased over the last week     Patient have trouble swallowing?  denies   Does the patient cook?  No   Any headaches?  denies   Chronic back pain  denies   Ambulates with difficulty?  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  1 year ago he had a mechanical fall and hit his head, no LOC  Acute Unilateral weakness, numbness or tingling?  denies  Any tremors?  Endorsed, "better since Saturday, may be medicine related" Any anosmia?  denies   Any incontinence of urine?  denies   Any bowel dysfunction?    denies      Patient lives   with his stepdaughter.  He is widowed since 2019. history of heavy alcohol intake? denies   History of heavy tobacco use? denies   Family history of dementia? Denies  Dose patient drive? Not for the last week, "He is scared after his confusion "  Allergies  Allergen Reactions    Sulfa Antibiotics Rash   Sulfasalazine Rash    Current Outpatient Medications  Medication Instructions   ALPRAZolam (XANAX) 1 mg, Every 6 hours PRN   atorvastatin (LIPITOR) 20 mg, Oral, Daily   diltiazem (CARDIZEM CD) 180 MG 24 hr capsule TAKE ONE CAPSULE BY MOUTH DAILY.   donepezil (ARICEPT) 5 mg, Oral, Daily   doxycycline (VIBRAMYCIN) 100 mg, Oral, 2 times daily   levothyroxine (SYNTHROID) 25 mcg, Oral, Daily before breakfast   lisinopril (ZESTRIL) 20 mg, Oral, 2 times daily   metFORMIN (GLUCOPHAGE) 500 mg, 2 times daily with meals   metoprolol succinate (TOPROL-XL) 50 MG 24 hr tablet TAKE ONE TABLET BY MOUTH TWICE DAILY WITH FOOD OR IMMEDIATELY FOLLOWING A MEAL.   Vitamin D, Ergocalciferol, (DRISDOL) 1.25 MG (50000 UNIT) CAPS capsule Take by mouth.   warfarin (COUMADIN) 4 MG tablet TAKE 1 TABLET BY MOUTH DAILY. EXCEPT 1 1/2 TABLETS ON SUNDAYS.     VITALS:   Vitals:   05/14/22 0749  BP: (!) 140/84  Pulse: 94  Resp: 20  SpO2: 97%  Weight: 222 lb (100.7 kg)  Height: 5' 10"$  (1.778 m)       No data to display          PHYSICAL EXAM   HEENT:  Normocephalic, atraumatic. The mucous membranes are moist. The superficial temporal arteries are without ropiness or tenderness. Cardiovascular: Regular rate and rhythm. Lungs: Clear to auscultation bilaterally. Neck: There are no carotid bruits noted bilaterally.  NEUROLOGICAL:    05/14/2022    8:00 AM  Montreal Cognitive Assessment   Visuospatial/ Executive (0/5) 0  Naming (0/3) 2  Attention: Read list of digits (0/2) 2  Attention: Read list of letters (0/1) 1  Attention: Serial 7 subtraction starting at 100 (0/3) 3  Language: Repeat phrase (0/2) 1  Language : Fluency (0/1) 0  Abstraction (0/2) 1  Delayed Recall (0/5) 5  Orientation (0/6) 3  Total 18  Adjusted Score (based on education) 19        No data to display           Orientation:  Alert and oriented to person, place and not to time. No aphasia or  dysarthria. Fund of knowledge is appropriate. Recent memory impaired and remote memory intact.  Attention and concentration are normal.  Able to name objects and repeat phrases. Delayed recall  5/5 Cranial nerves: There is good facial symmetry. Extraocular muscles are intact and visual fields are full to confrontational testing. Speech is fluent and clear. no tongue deviation. Hearing is reduced to conversational tone. Tone: Tone is good throughout. Sensation: Sensation is intact to light touch and pinprick throughout. Vibration is intact at the bilateral big toe.There is no extinction with double simultaneous stimulation. There is no sensory dermatomal level identified. Coordination: The patient has no difficulty with RAM's or FNF bilaterally. Normal finger to nose  Motor: Strength is 5/5 in the bilateral upper and right lower extremities.  He has residual left leg and foot weakness postpolio there is no pronator drift. There are no fasciculations noted. DTR's: Deep tendon reflexes are 2/4 at the bilateral biceps, triceps, brachioradialis, patella and achilles.  Plantar responses are downgoing bilaterally. Gait and Station: The patient is able to ambulate without difficulty. The patient is able to ambulate in a tandem fashion, able to stand in the Romberg position.     Thank you for allowing [place] the opportunity to participate in the care of this nice patient. Please do not hesitate to contact us for any questions or concerns.   Total time spent on today's visit was 60 minutes dedicated to this patient today, preparing to see patient, examining the patient, ordering tests and/or medications and counseling the patient, documenting clinical information in the EHR or other health record, independently interpreting results and communicating results to the patient/family, discussing treatment and goals, answering patient's questions and coordinating care.  Cc:  Redmond School, MD  Sharene Butters 05/14/2022  8:59 AM

## 2022-05-26 ENCOUNTER — Ambulatory Visit (INDEPENDENT_AMBULATORY_CARE_PROVIDER_SITE_OTHER): Payer: PPO | Admitting: "Endocrinology

## 2022-05-26 ENCOUNTER — Encounter: Payer: Self-pay | Admitting: "Endocrinology

## 2022-05-26 VITALS — BP 110/54 | HR 88 | Ht 70.0 in | Wt 218.0 lb

## 2022-05-26 DIAGNOSIS — E039 Hypothyroidism, unspecified: Secondary | ICD-10-CM

## 2022-05-26 DIAGNOSIS — E1169 Type 2 diabetes mellitus with other specified complication: Secondary | ICD-10-CM | POA: Diagnosis not present

## 2022-05-26 DIAGNOSIS — E270 Other adrenocortical overactivity: Secondary | ICD-10-CM | POA: Diagnosis not present

## 2022-05-26 LAB — POCT GLYCOSYLATED HEMOGLOBIN (HGB A1C): HbA1c, POC (controlled diabetic range): 6.2 % (ref 0.0–7.0)

## 2022-05-26 NOTE — Progress Notes (Unsigned)
Endocrinology Consult Note                                            05/26/2022, 9:58 AM   Subjective:    Patient ID: [name], male    DOB: Feb 28, 1946, PCP Redmond School, MD   Past Medical History:  Diagnosis Date   Anxiety    Arthritis    Complication of anesthesia    AFTER LUNG SURGERY   Diabetes mellitus ([place])    Dysrhythmia 03/2014   HX A-FIB WHEN HAVING COLONOSCOPY   Hypertension    Kidney stone    Obesity    Polio 1946   RESIDUAL LEFT LEG / FOOT WEAKNESS   PONV (postoperative nausea and vomiting)    Sleep apnea    USES C PAP   Past Surgical History:  Procedure Laterality Date   COLONOSCOPY N/A 02/01/2014   Procedure: COLONOSCOPY;  Surgeon: Rogene Houston, MD;  [facility]: [facility];  Service: Endoscopy;  Laterality: N/A;  1200   LUNG SURGERY N/A 2004   "SCRAPPED A FUNGUS OFF MY LUNG"   NEPHROLITHOTOMY Left 05/10/2014   Procedure: NEPHROLITHOTOMY PERCUTANEOUS;  Surgeon: Jorja Loa, MD;  Location: [facility];  Service: Urology;  Laterality: Left;   VASECTOMY     Social History   Socioeconomic History   Marital status: Married    Spouse name: Not on file   Number of children: Not on file   Years of education: Not on file   Highest education level: Not on file  Occupational History   Not on file  Tobacco Use   Smoking status: Former    Packs/day: 0.50    Years: 10.00    Total pack years: 5.00    Types: Cigarettes    Start date: 03/31/1951    Quit date: 11/17/2002    Years since quitting: 19.5   Smokeless tobacco: Never  Vaping Use   Vaping Use: Never used  Substance and Sexual Activity   Alcohol use: Not Currently    Alcohol/week: 0.0 standard drinks of alcohol    Comment: social   Drug use: No   Sexual activity: Not on file  Other Topics Concern   Not on file  Social History Narrative   Right handed   Drinks caffeine   Two story home daughter lives with daughter   retired   Science writer Determinants of Systems developer Strain: Not on file  Food Insecurity: Not on file  Transportation Needs: Not on file  Physical Activity: Not on file  Stress: Not on file  Social Connections: Not on file   Family History  Problem Relation Age of Onset   Heart attack Mother    Brain cancer Father    Drug abuse Brother    Outpatient Encounter Medications as of 05/26/2022  Medication Sig   Vitamin D, Ergocalciferol, (DRISDOL) 1.25 MG (50000 UNIT) CAPS capsule Take by mouth every 7 (seven) days.   atorvastatin (LIPITOR) 20 MG tablet Take 20 mg by mouth daily.   diltiazem (CARDIZEM CD) 180 MG 24 hr capsule TAKE ONE CAPSULE BY MOUTH DAILY. (Patient taking differently: Take 180 mg by mouth daily.)   donepezil (ARICEPT) 5 MG tablet Take 10 mg by mouth daily.   doxycycline (VIBRAMYCIN) 100 MG capsule Take 100 mg by mouth 2 (two) times daily.  levothyroxine (SYNTHROID) 25 MCG tablet Take 25 mcg by mouth daily before breakfast.    lisinopril (PRINIVIL,ZESTRIL) 20 MG tablet Take 20 mg by mouth 2 (two) times daily.    metFORMIN (GLUCOPHAGE) 500 MG tablet Take 500 mg by mouth 2 (two) times daily with a meal.   metoprolol succinate (TOPROL-XL) 50 MG 24 hr tablet TAKE ONE TABLET BY MOUTH TWICE DAILY WITH FOOD OR IMMEDIATELY FOLLOWING A MEAL.   warfarin (COUMADIN) 4 MG tablet TAKE 1 TABLET BY MOUTH DAILY. EXCEPT 1 1/2 TABLETS ON SUNDAYS. (Patient taking differently: 2 mg. Takes '2mg'$  daily)   [DISCONTINUED] ALPRAZolam (XANAX) 1 MG tablet Take 1 mg by mouth every 6 (six) hours as needed for anxiety.  (Patient not taking: Reported on 05/14/2022)   No facility-administered encounter medications on file as of 05/26/2022.   ALLERGIES: Allergies  Allergen Reactions   Sulfa Antibiotics Rash   Sulfasalazine Rash    VACCINATION STATUS: Immunization History  Administered Date(s) Administered   Influenza,inj,Quad PF,6+ Mos 02/20/2015   Tdap 07/15/2020    HPI [name] is 77 y.o. male who presents today with a medical  history as above. he is being seen in consultation for hypercortisolemia requested by Redmond School, MD.  [name] has multiple medical problems.  He is assisted by his frames to clinic.  He is not an optimal historian.  History is obtained mainly from chart review.  he has been dealing with symptoms of anxiety, disequilibrium, fatigue, for several weeks.  In January 2024, he was found to have mild elevation of a.m. cortisol.  Repeat labs in February showed persistence of elevated a.m. cortisol lower than the 1 from January, at this time also mild elevation of his p.m. cortisol.  Labs are summarized below. He denies recent exposure to steroids.  He denies any prior history of adrenal dysfunction. His other endocrine problems include hypothyroidism stable on levothyroxine 25 mcg p.o. daily before breakfast.  He is taking his levothyroxine with his other medications, however his most recent labs show target TSH. He also has well-controlled type 2 diabetes currently on metformin 500 mg p.o. twice daily.  His point-of-care A1c today 6.2%. Patient does not have acute or urgent complaints today.  He brought notes on several pages with detailing disorientation and agitation and general cognitive decline over time.  Patient has home support with cooking and cleaning.  He enjoys modest ambulation using a cane.    He spends a lot of time sitting as well.  He has unpredictable appetite and has been noticed to have lost weight recently-lost approximately 20 pounds since June 2023. He denies nausea, vomiting, abdominal pain at this time. His current medication list includes atorvastatin 20 mg p.o. daily, Cardizem 180 mg p.o. daily, Aricept 5 mg p.o. daily, levothyroxine 25 mcg p.o. daily, lisinopril 20 mg p.o. twice daily, metformin 500 mg p.o. twice daily, metoprolol 50 mg p.o. daily, vitamin D3 50,000 units weekly, and warfarin 2 mg p.o. daily. He is a former smoker. He lost his wife about 4 to 5 years ago and  reported to have difficulty adjusting.     Review of Systems  Constitutional: + Recent weight loss, + fatigue, no subjective hyperthermia, no subjective hypothermia, + ambulates with a support with a cane. Eyes: no blurry vision, no xerophthalmia ENT: no sore throat, no nodules palpated in throat, no dysphagia/odynophagia, no hoarseness Cardiovascular: no Chest Pain, no Shortness of Breath, no palpitations, no leg swelling Respiratory: no cough, no shortness of breath Gastrointestinal:  no Nausea/Vomiting/Diarhhea Musculoskeletal: no muscle/joint aches Skin: no rashes Neurological: + mild  tremors, no numbness, no tingling, no dizziness Psychiatric: no depression, no anxiety  Objective:       05/26/2022    8:18 AM 05/14/2022    7:49 AM 09/02/2021   10:22 AM  Vitals with BMI  Height '5\' 10"'$  '5\' 10"'$  '5\' 10"'$   Weight 218 lbs 222 lbs 241 lbs 6 oz  BMI 31.28 [id number]  Systolic [id number]  Diastolic 54 84 84  Pulse 88 94 90    BP (!) 110/54   Pulse 88   Ht '5\' 10"'$  (1.778 m)   Wt 218 lb (98.9 kg)   BMI 31.28 kg/m   Wt Readings from Last 3 Encounters:  05/26/22 218 lb (98.9 kg)  05/14/22 222 lb (100.7 kg)  09/02/21 241 lb 6.4 oz (109.5 kg)    Physical Exam  Constitutional:  Body mass index is 31.28 kg/m.,  not in acute distress, + anxious state of mind, Eyes: PERRLA, EOMI, no exophthalmos ENT: moist mucous membranes, no gross thyromegaly, no gross cervical lymphadenopathy Cardiovascular: normal precordial activity, + distant heart sounds, no Murmur/Rubs/Gallops Respiratory:  adequate breathing efforts, no gross chest deformity, Clear to auscultation bilaterally Gastrointestinal: abdomen soft, Non -tender, No distension, Bowel Sounds present, no gross organomegaly Musculoskeletal: no gross deformities, strength intact in all four extremities Skin: moist,  + dry skin, no rashes Neurological: + mild tremor with outstretched hands, Deep tendon reflexes normal in bilateral lower  extremities.  No gross peripheral edema.  CMP ( most recent) CMP     Component Value Date/Time   NA 139 07/15/2020 1843   K 4.8 07/15/2020 1843   CL 106 07/15/2020 1843   CO2 25 07/15/2020 1843   GLUCOSE 130 (H) 07/15/2020 1843   BUN 25 (H) 07/15/2020 1843   CREATININE 1.20 07/15/2020 1843   CREATININE 1.32 03/12/2014 1411   CALCIUM 9.1 07/15/2020 1843   PROT 7.3 02/01/2014 1506   ALBUMIN 3.9 02/01/2014 1506   AST 28 02/01/2014 1506   ALT 26 02/01/2014 1506   ALKPHOS 32 (L) 02/01/2014 1506   BILITOT 1.1 02/01/2014 1506   GFRNONAA >60 07/15/2020 1843   GFRAA 72 (L) 05/10/2014 0755     Diabetic Labs (most recent): Lab Results  Component Value Date   HGBA1C 6.2 05/26/2022   HGBA1C 7.2 (H) 02/01/2014      Lab Results  Component Value Date   TSH 3.61 05/14/2022   TSH 1.570 02/01/2014    04/27/22 AM cortisol 42.2  May 08, 2022 AM cortisol 25.1 ( 6.2- 19.4) , PM cortisol 17.4 ( 2.6-11.9), ACTH 0.06  Assessment & Plan:   1. Type 2 diabetes mellitus with other specified complication, without long-term current use of insulin ([place]) 2. 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 Hypothyroidism, unspecified type   - [name]  is being seen at a kind request of Redmond School, MD. -This pleasant patient has multiple medical problems as well as social problems.  I have reviewed his available endocrine records and clinically evaluated the patient. - Based on these reviews, he has well-controlled type 2 diabetes with point-of-care A1c of 6.2%, well replaced hypothyroidism on low-dose levothyroxine, and was recently found to have mild elevation of cortisol a.m. and p.m. He was observed that his a.m. cortisol was 42.2 in January, however dropped to 25 in February. At this time, the information is not complete diagnosis with Cushing syndrome.  Pretest probability for Cushing syndrome is very low.  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  This patient will need a complete  assessment with 24-hour urine collection for free cortisol measurement. Due to his propensity for disequilibrium, looks like he is scheduled to have MRI of brain.  This will also assess the pituitary gland hopefully.  If his next hormone assessment indicates endogenous hypercortisolemia, he will be considered for further/adrenal imaging studies accordingly. Regarding his type 2 diabetes, this is well-controlled.  His point-of-care A1c is 6.2% today.  He is advised to continue metformin 500 mg p.o. twice daily.  For his hypothyroidism: Recent TSH was on target.  He is advised to take his levothyroxine 25 mcg p.o. daily before breakfast.    He will continue to need social support with daily needs including cooking and feeding. He is encouraged to stay active using his support with a cane to avoid falls while undergoing his workup. He does not have declining cognitive function, with multiple risk factors for dementia.   - I did not initiate any new prescriptions today. - he is advised to maintain close follow up with Redmond School, MD for primary care needs.   - Time spent with the patient: 45 minutes, of which >50% was spent in  counseling him about his mild hypercortisolemia , hypothyroidism, type 2 diabetes and the rest in obtaining information about his symptoms, reviewing his previous labs/studies ( including abstractions from other facilities),  evaluations, and treatments,  and developing a plan to confirm diagnosis and long term treatment based on the latest standards of care/guidelines; and documenting his care.  [name] participated in the discussions, expressed understanding, and voiced agreement with the above plans.  All questions were answered to his satisfaction. he is encouraged to contact clinic should he have any questions or concerns prior to his return visit.  Follow up plan: Return in about 2 weeks (around 06/09/2022) for [place], MD [facility] [address] [place], [place] [address] Phone: [phone]  Fax: [phone]     05/26/2022, 9:58 AM  This note was partially dictated with voice recognition software. Similar sounding words can be transcribed inadequately or may not  be corrected upon review.

## 2022-06-01 DIAGNOSIS — E270 Other adrenocortical overactivity: Secondary | ICD-10-CM | POA: Diagnosis not present

## 2022-06-04 DIAGNOSIS — Z7984 Long term (current) use of oral hypoglycemic drugs: Secondary | ICD-10-CM | POA: Diagnosis not present

## 2022-06-04 DIAGNOSIS — F0390 Unspecified dementia without behavioral disturbance: Secondary | ICD-10-CM | POA: Diagnosis not present

## 2022-06-04 DIAGNOSIS — E1122 Type 2 diabetes mellitus with diabetic chronic kidney disease: Secondary | ICD-10-CM | POA: Diagnosis not present

## 2022-06-04 DIAGNOSIS — Z7901 Long term (current) use of anticoagulants: Secondary | ICD-10-CM | POA: Diagnosis not present

## 2022-06-04 DIAGNOSIS — H9190 Unspecified hearing loss, unspecified ear: Secondary | ICD-10-CM | POA: Diagnosis not present

## 2022-06-04 DIAGNOSIS — E1165 Type 2 diabetes mellitus with hyperglycemia: Secondary | ICD-10-CM | POA: Diagnosis not present

## 2022-06-04 DIAGNOSIS — I4891 Unspecified atrial fibrillation: Secondary | ICD-10-CM | POA: Diagnosis not present

## 2022-06-04 DIAGNOSIS — N189 Chronic kidney disease, unspecified: Secondary | ICD-10-CM | POA: Diagnosis not present

## 2022-06-04 DIAGNOSIS — Z87891 Personal history of nicotine dependence: Secondary | ICD-10-CM | POA: Diagnosis not present

## 2022-06-04 DIAGNOSIS — F0394 Unspecified dementia, unspecified severity, with anxiety: Secondary | ICD-10-CM | POA: Diagnosis not present

## 2022-06-04 DIAGNOSIS — I129 Hypertensive chronic kidney disease with stage 1 through stage 4 chronic kidney disease, or unspecified chronic kidney disease: Secondary | ICD-10-CM | POA: Diagnosis not present

## 2022-06-05 LAB — CREATININE, URINE, 24 HOUR
Creatinine, 24H Ur: 898 mg/24 hr — ABNORMAL LOW (ref 1000–2000)
Creatinine, Urine: 99.8 mg/dL

## 2022-06-05 LAB — CORTISOL, URINE, FREE
Cortisol (Ur), Free: 24 ug/24 hr (ref 5–64)
Cortisol,F,ug/L,U: 27 ug/L

## 2022-06-08 ENCOUNTER — Ambulatory Visit (INDEPENDENT_AMBULATORY_CARE_PROVIDER_SITE_OTHER): Payer: Self-pay | Admitting: *Deleted

## 2022-06-08 DIAGNOSIS — Z5181 Encounter for therapeutic drug level monitoring: Secondary | ICD-10-CM

## 2022-06-08 DIAGNOSIS — I4891 Unspecified atrial fibrillation: Secondary | ICD-10-CM

## 2022-06-08 LAB — POCT INR: INR: 1.1 — AB (ref 2.0–3.0)

## 2022-06-08 NOTE — Patient Instructions (Signed)
Pt was just enrolled with [facility] last week.  They are not sure what the pt has been taking re. Warfarin.  Use warfarin '4mg'$  tablet only Start warfarin 1 tablet ('4mg'$ ) daily  Continue greens Recheck in 1 wk by [facility].  Orders Given to [employer]

## 2022-06-10 ENCOUNTER — Ambulatory Visit (HOSPITAL_COMMUNITY): Payer: HMO

## 2022-06-10 ENCOUNTER — Encounter (HOSPITAL_COMMUNITY): Payer: Self-pay

## 2022-06-12 DIAGNOSIS — I4891 Unspecified atrial fibrillation: Secondary | ICD-10-CM | POA: Diagnosis not present

## 2022-06-12 DIAGNOSIS — Z7901 Long term (current) use of anticoagulants: Secondary | ICD-10-CM | POA: Diagnosis not present

## 2022-06-12 DIAGNOSIS — E1122 Type 2 diabetes mellitus with diabetic chronic kidney disease: Secondary | ICD-10-CM | POA: Diagnosis not present

## 2022-06-12 DIAGNOSIS — N189 Chronic kidney disease, unspecified: Secondary | ICD-10-CM | POA: Diagnosis not present

## 2022-06-12 DIAGNOSIS — F0390 Unspecified dementia without behavioral disturbance: Secondary | ICD-10-CM | POA: Diagnosis not present

## 2022-06-12 DIAGNOSIS — Z7984 Long term (current) use of oral hypoglycemic drugs: Secondary | ICD-10-CM | POA: Diagnosis not present

## 2022-06-12 DIAGNOSIS — I129 Hypertensive chronic kidney disease with stage 1 through stage 4 chronic kidney disease, or unspecified chronic kidney disease: Secondary | ICD-10-CM | POA: Diagnosis not present

## 2022-06-12 DIAGNOSIS — H9190 Unspecified hearing loss, unspecified ear: Secondary | ICD-10-CM | POA: Diagnosis not present

## 2022-06-12 DIAGNOSIS — F0394 Unspecified dementia, unspecified severity, with anxiety: Secondary | ICD-10-CM | POA: Diagnosis not present

## 2022-06-12 DIAGNOSIS — Z87891 Personal history of nicotine dependence: Secondary | ICD-10-CM | POA: Diagnosis not present

## 2022-06-12 DIAGNOSIS — E1165 Type 2 diabetes mellitus with hyperglycemia: Secondary | ICD-10-CM | POA: Diagnosis not present

## 2022-06-16 ENCOUNTER — Ambulatory Visit (INDEPENDENT_AMBULATORY_CARE_PROVIDER_SITE_OTHER): Payer: PPO | Admitting: "Endocrinology

## 2022-06-16 ENCOUNTER — Encounter: Payer: Self-pay | Admitting: "Endocrinology

## 2022-06-16 ENCOUNTER — Ambulatory Visit (INDEPENDENT_AMBULATORY_CARE_PROVIDER_SITE_OTHER): Payer: Self-pay | Admitting: *Deleted

## 2022-06-16 VITALS — BP 126/78 | HR 96 | Ht 70.0 in | Wt 216.2 lb

## 2022-06-16 DIAGNOSIS — I4891 Unspecified atrial fibrillation: Secondary | ICD-10-CM

## 2022-06-16 DIAGNOSIS — F0390 Unspecified dementia without behavioral disturbance: Secondary | ICD-10-CM | POA: Diagnosis not present

## 2022-06-16 DIAGNOSIS — E039 Hypothyroidism, unspecified: Secondary | ICD-10-CM

## 2022-06-16 DIAGNOSIS — E1169 Type 2 diabetes mellitus with other specified complication: Secondary | ICD-10-CM | POA: Diagnosis not present

## 2022-06-16 DIAGNOSIS — Z7984 Long term (current) use of oral hypoglycemic drugs: Secondary | ICD-10-CM | POA: Diagnosis not present

## 2022-06-16 DIAGNOSIS — I129 Hypertensive chronic kidney disease with stage 1 through stage 4 chronic kidney disease, or unspecified chronic kidney disease: Secondary | ICD-10-CM | POA: Diagnosis not present

## 2022-06-16 DIAGNOSIS — Z87891 Personal history of nicotine dependence: Secondary | ICD-10-CM | POA: Diagnosis not present

## 2022-06-16 DIAGNOSIS — N189 Chronic kidney disease, unspecified: Secondary | ICD-10-CM | POA: Diagnosis not present

## 2022-06-16 DIAGNOSIS — E1122 Type 2 diabetes mellitus with diabetic chronic kidney disease: Secondary | ICD-10-CM | POA: Diagnosis not present

## 2022-06-16 DIAGNOSIS — Z7901 Long term (current) use of anticoagulants: Secondary | ICD-10-CM | POA: Diagnosis not present

## 2022-06-16 DIAGNOSIS — E270 Other adrenocortical overactivity: Secondary | ICD-10-CM | POA: Diagnosis not present

## 2022-06-16 DIAGNOSIS — H9190 Unspecified hearing loss, unspecified ear: Secondary | ICD-10-CM | POA: Diagnosis not present

## 2022-06-16 DIAGNOSIS — Z5181 Encounter for therapeutic drug level monitoring: Secondary | ICD-10-CM

## 2022-06-16 DIAGNOSIS — F0394 Unspecified dementia, unspecified severity, with anxiety: Secondary | ICD-10-CM | POA: Diagnosis not present

## 2022-06-16 DIAGNOSIS — E1165 Type 2 diabetes mellitus with hyperglycemia: Secondary | ICD-10-CM | POA: Diagnosis not present

## 2022-06-16 LAB — POCT INR: INR: 1.9 — AB (ref 2.0–3.0)

## 2022-06-16 NOTE — Patient Instructions (Signed)
Use warfarin 4mg  tablet and/or 6mg  tablet Increase warfarin to 4mg  daily except 6mg  on Tuesdays Recheck in 1 wk by [facility].  Orders Given to [employer]

## 2022-06-16 NOTE — Progress Notes (Signed)
06/16/2022, 1:28 PM  Endocrinology follow-up note   Subjective:    Patient ID: [name], male    DOB: 13-Aug-1945, PCP Redmond School, MD   Past Medical History:  Diagnosis Date   Anxiety    Arthritis    Complication of anesthesia    AFTER LUNG SURGERY   Diabetes mellitus ([place])    Dysrhythmia 03/2014   HX A-FIB WHEN HAVING COLONOSCOPY   Hypertension    Kidney stone    Obesity    Polio 1946   RESIDUAL LEFT LEG / FOOT WEAKNESS   PONV (postoperative nausea and vomiting)    Sleep apnea    USES C PAP   Past Surgical History:  Procedure Laterality Date   COLONOSCOPY N/A 02/01/2014   Procedure: COLONOSCOPY;  Surgeon: Rogene Houston, MD;  [facility];  Service: Endoscopy;  Laterality: N/A;  1200   LUNG SURGERY N/A 2004   "SCRAPPED A FUNGUS OFF MY LUNG"   NEPHROLITHOTOMY Left 05/10/2014   Procedure: NEPHROLITHOTOMY PERCUTANEOUS;  Surgeon: Jorja Loa, MD;  Location: [facility];  Service: Urology;  Laterality: Left;   VASECTOMY     Social History   Socioeconomic History   Marital status: Married    Spouse name: Not on file   Number of children: Not on file   Years of education: Not on file   Highest education level: Not on file  Occupational History   Not on file  Tobacco Use   Smoking status: Former    Packs/day: 0.50    Years: 10.00    Additional pack years: 0.00    Total pack years: 5.00    Types: Cigarettes    Start date: 03/31/1951    Quit date: 11/17/2002    Years since quitting: 19.5   Smokeless tobacco: Never  Vaping Use   Vaping Use: Never used  Substance and Sexual Activity   Alcohol use: Not Currently    Alcohol/week: 0.0 standard drinks of alcohol    Comment: social   Drug use: No   Sexual activity: Not on file  Other Topics Concern   Not on file  Social History Narrative   Right handed   Drinks caffeine   Two story home daughter lives with daughter   retired   Science writer  Determinants of Radio broadcast assistant Strain: Not on file  Food Insecurity: Not on file  Transportation Needs: Not on file  Physical Activity: Not on file  Stress: Not on file  Social Connections: Not on file   Family History  Problem Relation Age of Onset   Heart attack Mother    Brain cancer Father    Drug abuse Brother    Outpatient Encounter Medications as of 06/16/2022  Medication Sig   atorvastatin (LIPITOR) 20 MG tablet Take 20 mg by mouth daily.   diltiazem (CARDIZEM CD) 180 MG 24 hr capsule TAKE ONE CAPSULE BY MOUTH DAILY. (Patient taking differently: Take 180 mg by mouth daily.)   donepezil (ARICEPT) 5 MG tablet Take 10 mg by mouth daily.   doxycycline (VIBRAMYCIN) 100 MG capsule Take 100 mg by mouth 2 (two) times daily.   levothyroxine (SYNTHROID) 25 MCG tablet Take 25 mcg by mouth  daily before breakfast.    lisinopril (PRINIVIL,ZESTRIL) 20 MG tablet Take 20 mg by mouth 2 (two) times daily.    metFORMIN (GLUCOPHAGE) 500 MG tablet Take 500 mg by mouth 2 (two) times daily with a meal.   metoprolol succinate (TOPROL-XL) 50 MG 24 hr tablet TAKE ONE TABLET BY MOUTH TWICE DAILY WITH FOOD OR IMMEDIATELY FOLLOWING A MEAL.   Vitamin D, Ergocalciferol, (DRISDOL) 1.25 MG (50000 UNIT) CAPS capsule Take by mouth every 7 (seven) days.   warfarin (COUMADIN) 4 MG tablet TAKE 1 TABLET BY MOUTH DAILY. EXCEPT 1 1/2 TABLETS ON SUNDAYS. (Patient taking differently: 2 mg. Takes 4mg  daily)   No facility-administered encounter medications on file as of 06/16/2022.   ALLERGIES: Allergies  Allergen Reactions   Sulfa Antibiotics Rash   Sulfasalazine Rash    VACCINATION STATUS: Immunization History  Administered Date(s) Administered   Influenza,inj,Quad PF,6+ Mos 02/20/2015   Tdap 07/15/2020    HPI [name] is 77 y.o. male who presents today with a medical history as above. he is being seen in follow-up after he was seen in consultation for hypercortisolemia requested by  Redmond School, MD.  He is accompanied by his daughter [name] today. [name] has multiple medical problems.  He is assisted by his frames to clinic.  He is not an optimal historian.  History is obtained mainly from chart review.  he has been dealing with symptoms of anxiety, disequilibrium, fatigue, for several weeks.  In January 2024, he was found to have mild elevation of a.m. cortisol.  Repeat labs in February showed persistence of elevated a.m. cortisol lower than the 1 from January, at this time also mild elevation of his p.m. cortisol.  His previsit 24-hour urine cortisol was not elevated at 24. He denies recent exposure to steroids.  He denies any prior history of adrenal dysfunction. His other endocrine problems include hypothyroidism stable on levothyroxine 25 mcg p.o. daily before breakfast.  He is taking his levothyroxine with his other medications, however his most recent labs show target TSH. He also has well-controlled type 2 diabetes currently on metformin 500 mg p.o. twice daily.  His point-of-care A1c during his last visit was 6.2%.   Patient does not have acute or urgent complaints today.  He brought notes on several pages with detailing disorientation and agitation and general cognitive decline over time.  Patient has home support with cooking and cleaning.  He enjoys modest ambulation using a cane.    He spends a lot of time sitting as well.  He has unpredictable appetite and has been noticed to have lost weight recently-lost approximately 20 pounds since June 2023. He denies nausea, vomiting, abdominal pain at this time. His current medication list includes atorvastatin 20 mg p.o. daily, Cardizem 180 mg p.o. daily, Aricept 5 mg p.o. daily, levothyroxine 25 mcg p.o. daily, lisinopril 20 mg p.o. twice daily, metformin 500 mg p.o. twice daily, metoprolol 50 mg p.o. daily, vitamin D3 50,000 units weekly, and warfarin 2 mg p.o. daily. He is a former smoker. He lost his wife about 4  to 5 years ago and reported to have difficulty adjusting.     Review of Systems  Constitutional: + Recent weight loss, + fatigue, no subjective hyperthermia, no subjective hypothermia, + ambulates with a support with a cane. Eyes: no blurry vision, no xerophthalmia ENT: no sore throat, no nodules palpated in throat, no dysphagia/odynophagia, no hoarseness   Objective:       06/16/2022    8:57  AM 05/26/2022    8:18 AM 05/14/2022    7:49 AM  Vitals with BMI  Height 5\' 10"  5\' 10"  5\' 10"   Weight 216 lbs 3 oz 218 lbs 222 lbs  BMI 31.02 [id number] [id number]  Systolic 123XX123 A999333 XX123456  Diastolic 78 54 84  Pulse 96 88 94    BP 126/78   Pulse 96   Ht 5\' 10"  (1.778 m)   Wt 216 lb 3.2 oz (98.1 kg)   BMI 31.02 kg/m   Wt Readings from Last 3 Encounters:  06/16/22 216 lb 3.2 oz (98.1 kg)  05/26/22 218 lb (98.9 kg)  05/14/22 222 lb (100.7 kg)    Physical Exam  Constitutional:  Body mass index is 31.02 kg/m.,  not in acute distress, + anxious state of mind, Eyes: PERRLA, EOMI, no exophthalmos ENT: moist mucous membranes, no gross thyromegaly, no gross cervical lymphadenopathy Cardiovascular: normal precordial activity, + distant heart sounds, no Murmur/Rubs/Gallops   CMP ( most recent) CMP     Component Value Date/Time   NA 139 07/15/2020 1843   K 4.8 07/15/2020 1843   CL 106 07/15/2020 1843   CO2 25 07/15/2020 1843   GLUCOSE 130 (H) 07/15/2020 1843   BUN 25 (H) 07/15/2020 1843   CREATININE 1.20 07/15/2020 1843   CREATININE 1.32 03/12/2014 1411   CALCIUM 9.1 07/15/2020 1843   PROT 7.3 02/01/2014 1506   ALBUMIN 3.9 02/01/2014 1506   AST 28 02/01/2014 1506   ALT 26 02/01/2014 1506   ALKPHOS 32 (L) 02/01/2014 1506   BILITOT 1.1 02/01/2014 1506   GFRNONAA >60 07/15/2020 1843   GFRAA 72 (L) 05/10/2014 0755     Diabetic Labs (most recent): Lab Results  Component Value Date   HGBA1C 6.2 05/26/2022   HGBA1C 7.2 (H) 02/01/2014      Lab Results  Component Value Date   TSH 3.61  05/14/2022   TSH 1.570 02/01/2014    04/27/22 AM cortisol 42.2  May 08, 2022 AM cortisol 25.1 ( 6.2- 19.4) , PM cortisol 17.4 ( 2.6-11.9), ACTH 0.06  Assessment & Plan:   1. Type 2 diabetes mellitus with other specified complication, without long-term current use of insulin  2. Hypercortisolemia  3. Hypothyroidism  -This pleasant patient has multiple medical problems as well as social problems.  I have reviewed his  new and available endocrine records and clinically evaluated the patient. - Based on these reviews, he has well-controlled type 2 diabetes with point-of-care A1c of 6.2%, well replaced hypothyroidism on low-dose levothyroxine, and was recently found to have mild elevation of cortisol a.m. and p.m. He was observed that his a.m. cortisol was 42.2 in January, however dropped to 25 in February. Based on this information, he was sent for 24-hour urine free cortisol measurement which is not consistent with Cushing syndrome.  See above.   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    Due to his propensity for disequilibrium, looks like he is scheduled to have MRI of brain.  This will also assess the pituitary gland hopefully.    Regarding his type 2 diabetes, this is well-controlled.  His point-of-care A1c is 6.2% today.  He is advised to continue metformin 500 mg p.o. twice daily.   For his hypothyroidism: Recent TSH was on target.  He is advised to take his levothyroxine 25 mcg p.o. daily before breakfast.    He will continue to need social support with daily needs including cooking and feeding. He is encouraged to  stay active using his support with a cane to avoid falls while undergoing his workup. He does have declining cognitive function, with multiple risk factors for dementia.   - he is advised to maintain close follow up with Redmond School, MD for primary care needs.   I spent  26  minutes in the care of the patient today including review of labs from Thyroid  Function, CMP, and other relevant labs ; imaging/biopsy records (current and previous including abstractions from other facilities); face-to-face time discussing  his lab results and symptoms, medications doses, his options of short and long term treatment based on the latest standards of care / guidelines;   and documenting the encounter.  Irven Coe  participated in the discussions, expressed understanding, and voiced agreement with the above plans.  All questions were answered to his satisfaction. he is encouraged to contact clinic should he have any questions or concerns prior to his return visit.   Follow up plan: Return in about 6 months (around 12/17/2022) for Fasting Labs  in AM B4 8, A1c -[place].   Glade Lloyd, MD [facility] [address] [place], [place] [address] Phone: [phone]  Fax: [phone]     06/16/2022, 1:28 PM  This note was partially dictated with voice recognition software. Similar sounding words can be transcribed inadequately or may not  be corrected upon review.

## 2022-06-16 NOTE — Patient Instructions (Signed)

## 2022-06-23 ENCOUNTER — Ambulatory Visit (INDEPENDENT_AMBULATORY_CARE_PROVIDER_SITE_OTHER): Payer: PPO | Admitting: *Deleted

## 2022-06-23 DIAGNOSIS — Z7901 Long term (current) use of anticoagulants: Secondary | ICD-10-CM | POA: Diagnosis not present

## 2022-06-23 DIAGNOSIS — Z5181 Encounter for therapeutic drug level monitoring: Secondary | ICD-10-CM | POA: Diagnosis not present

## 2022-06-23 DIAGNOSIS — Z87891 Personal history of nicotine dependence: Secondary | ICD-10-CM | POA: Diagnosis not present

## 2022-06-23 DIAGNOSIS — E1122 Type 2 diabetes mellitus with diabetic chronic kidney disease: Secondary | ICD-10-CM | POA: Diagnosis not present

## 2022-06-23 DIAGNOSIS — E1165 Type 2 diabetes mellitus with hyperglycemia: Secondary | ICD-10-CM | POA: Diagnosis not present

## 2022-06-23 DIAGNOSIS — I129 Hypertensive chronic kidney disease with stage 1 through stage 4 chronic kidney disease, or unspecified chronic kidney disease: Secondary | ICD-10-CM | POA: Diagnosis not present

## 2022-06-23 DIAGNOSIS — Z7984 Long term (current) use of oral hypoglycemic drugs: Secondary | ICD-10-CM | POA: Diagnosis not present

## 2022-06-23 DIAGNOSIS — I4891 Unspecified atrial fibrillation: Secondary | ICD-10-CM

## 2022-06-23 DIAGNOSIS — F0394 Unspecified dementia, unspecified severity, with anxiety: Secondary | ICD-10-CM | POA: Diagnosis not present

## 2022-06-23 DIAGNOSIS — N189 Chronic kidney disease, unspecified: Secondary | ICD-10-CM | POA: Diagnosis not present

## 2022-06-23 DIAGNOSIS — H9190 Unspecified hearing loss, unspecified ear: Secondary | ICD-10-CM | POA: Diagnosis not present

## 2022-06-23 DIAGNOSIS — F0390 Unspecified dementia without behavioral disturbance: Secondary | ICD-10-CM | POA: Diagnosis not present

## 2022-06-23 LAB — POCT INR: INR: 1.9 — AB (ref 2.0–3.0)

## 2022-06-23 NOTE — Patient Instructions (Signed)
Use warfarin 4mg  tablet and/or 6mg  tablet Increase warfarin to 4mg  daily except 6mg  on Tuesdays and Fridays Recheck in 1 wk by [facility].  Orders Given to [employer]

## 2022-06-30 ENCOUNTER — Ambulatory Visit (INDEPENDENT_AMBULATORY_CARE_PROVIDER_SITE_OTHER): Payer: Self-pay | Admitting: *Deleted

## 2022-06-30 DIAGNOSIS — H9190 Unspecified hearing loss, unspecified ear: Secondary | ICD-10-CM | POA: Diagnosis not present

## 2022-06-30 DIAGNOSIS — E1165 Type 2 diabetes mellitus with hyperglycemia: Secondary | ICD-10-CM | POA: Diagnosis not present

## 2022-06-30 DIAGNOSIS — E1122 Type 2 diabetes mellitus with diabetic chronic kidney disease: Secondary | ICD-10-CM | POA: Diagnosis not present

## 2022-06-30 DIAGNOSIS — F0390 Unspecified dementia without behavioral disturbance: Secondary | ICD-10-CM | POA: Diagnosis not present

## 2022-06-30 DIAGNOSIS — I4891 Unspecified atrial fibrillation: Secondary | ICD-10-CM | POA: Diagnosis not present

## 2022-06-30 DIAGNOSIS — N189 Chronic kidney disease, unspecified: Secondary | ICD-10-CM | POA: Diagnosis not present

## 2022-06-30 DIAGNOSIS — F0394 Unspecified dementia, unspecified severity, with anxiety: Secondary | ICD-10-CM | POA: Diagnosis not present

## 2022-06-30 DIAGNOSIS — Z7901 Long term (current) use of anticoagulants: Secondary | ICD-10-CM | POA: Diagnosis not present

## 2022-06-30 DIAGNOSIS — Z7984 Long term (current) use of oral hypoglycemic drugs: Secondary | ICD-10-CM | POA: Diagnosis not present

## 2022-06-30 DIAGNOSIS — Z5181 Encounter for therapeutic drug level monitoring: Secondary | ICD-10-CM

## 2022-06-30 DIAGNOSIS — I129 Hypertensive chronic kidney disease with stage 1 through stage 4 chronic kidney disease, or unspecified chronic kidney disease: Secondary | ICD-10-CM | POA: Diagnosis not present

## 2022-06-30 DIAGNOSIS — Z87891 Personal history of nicotine dependence: Secondary | ICD-10-CM | POA: Diagnosis not present

## 2022-06-30 LAB — POCT INR: INR: 2.1 (ref 2.0–3.0)

## 2022-06-30 NOTE — Patient Instructions (Signed)
Use warfarin 4mg  tablet and/or 6mg  tablet Continue warfarin 4mg  daily except 6mg  on Tuesdays and Fridays Recheck in 1 wk by [facility].  Orders Given to [employer]

## 2022-07-03 DIAGNOSIS — Z87891 Personal history of nicotine dependence: Secondary | ICD-10-CM | POA: Diagnosis not present

## 2022-07-03 DIAGNOSIS — E1122 Type 2 diabetes mellitus with diabetic chronic kidney disease: Secondary | ICD-10-CM | POA: Diagnosis not present

## 2022-07-03 DIAGNOSIS — E1165 Type 2 diabetes mellitus with hyperglycemia: Secondary | ICD-10-CM | POA: Diagnosis not present

## 2022-07-03 DIAGNOSIS — F0390 Unspecified dementia without behavioral disturbance: Secondary | ICD-10-CM | POA: Diagnosis not present

## 2022-07-03 DIAGNOSIS — Z7901 Long term (current) use of anticoagulants: Secondary | ICD-10-CM | POA: Diagnosis not present

## 2022-07-03 DIAGNOSIS — N189 Chronic kidney disease, unspecified: Secondary | ICD-10-CM | POA: Diagnosis not present

## 2022-07-03 DIAGNOSIS — I129 Hypertensive chronic kidney disease with stage 1 through stage 4 chronic kidney disease, or unspecified chronic kidney disease: Secondary | ICD-10-CM | POA: Diagnosis not present

## 2022-07-03 DIAGNOSIS — F0394 Unspecified dementia, unspecified severity, with anxiety: Secondary | ICD-10-CM | POA: Diagnosis not present

## 2022-07-03 DIAGNOSIS — H9190 Unspecified hearing loss, unspecified ear: Secondary | ICD-10-CM | POA: Diagnosis not present

## 2022-07-03 DIAGNOSIS — I4891 Unspecified atrial fibrillation: Secondary | ICD-10-CM | POA: Diagnosis not present

## 2022-07-03 DIAGNOSIS — Z7984 Long term (current) use of oral hypoglycemic drugs: Secondary | ICD-10-CM | POA: Diagnosis not present

## 2022-07-07 ENCOUNTER — Ambulatory Visit (INDEPENDENT_AMBULATORY_CARE_PROVIDER_SITE_OTHER): Payer: Self-pay | Admitting: *Deleted

## 2022-07-07 DIAGNOSIS — Z87891 Personal history of nicotine dependence: Secondary | ICD-10-CM | POA: Diagnosis not present

## 2022-07-07 DIAGNOSIS — H9190 Unspecified hearing loss, unspecified ear: Secondary | ICD-10-CM | POA: Diagnosis not present

## 2022-07-07 DIAGNOSIS — F0390 Unspecified dementia without behavioral disturbance: Secondary | ICD-10-CM | POA: Diagnosis not present

## 2022-07-07 DIAGNOSIS — N189 Chronic kidney disease, unspecified: Secondary | ICD-10-CM | POA: Diagnosis not present

## 2022-07-07 DIAGNOSIS — E1122 Type 2 diabetes mellitus with diabetic chronic kidney disease: Secondary | ICD-10-CM | POA: Diagnosis not present

## 2022-07-07 DIAGNOSIS — Z5181 Encounter for therapeutic drug level monitoring: Secondary | ICD-10-CM

## 2022-07-07 DIAGNOSIS — I129 Hypertensive chronic kidney disease with stage 1 through stage 4 chronic kidney disease, or unspecified chronic kidney disease: Secondary | ICD-10-CM | POA: Diagnosis not present

## 2022-07-07 DIAGNOSIS — F0394 Unspecified dementia, unspecified severity, with anxiety: Secondary | ICD-10-CM | POA: Diagnosis not present

## 2022-07-07 DIAGNOSIS — I4891 Unspecified atrial fibrillation: Secondary | ICD-10-CM

## 2022-07-07 DIAGNOSIS — Z7901 Long term (current) use of anticoagulants: Secondary | ICD-10-CM | POA: Diagnosis not present

## 2022-07-07 DIAGNOSIS — E1165 Type 2 diabetes mellitus with hyperglycemia: Secondary | ICD-10-CM | POA: Diagnosis not present

## 2022-07-07 DIAGNOSIS — Z7984 Long term (current) use of oral hypoglycemic drugs: Secondary | ICD-10-CM | POA: Diagnosis not present

## 2022-07-07 LAB — POCT INR: INR: 1.9 — AB (ref 2.0–3.0)

## 2022-07-07 NOTE — Patient Instructions (Signed)
Use warfarin 4mg  tablet and/or 6mg  tablet Increase warfarin to 4mg  daily except 6mg  on Tuesdays, Thursdays and Saturdays Recheck in 1 wk by [facility].  Orders Given to [employer]

## 2022-07-13 ENCOUNTER — Telehealth: Payer: Self-pay | Admitting: Cardiovascular Disease

## 2022-07-13 ENCOUNTER — Ambulatory Visit (INDEPENDENT_AMBULATORY_CARE_PROVIDER_SITE_OTHER): Payer: PPO | Admitting: *Deleted

## 2022-07-13 ENCOUNTER — Encounter (INDEPENDENT_AMBULATORY_CARE_PROVIDER_SITE_OTHER): Payer: HMO | Admitting: Ophthalmology

## 2022-07-13 DIAGNOSIS — H9190 Unspecified hearing loss, unspecified ear: Secondary | ICD-10-CM | POA: Diagnosis not present

## 2022-07-13 DIAGNOSIS — E1122 Type 2 diabetes mellitus with diabetic chronic kidney disease: Secondary | ICD-10-CM | POA: Diagnosis not present

## 2022-07-13 DIAGNOSIS — F0390 Unspecified dementia without behavioral disturbance: Secondary | ICD-10-CM | POA: Diagnosis not present

## 2022-07-13 DIAGNOSIS — Z5181 Encounter for therapeutic drug level monitoring: Secondary | ICD-10-CM | POA: Diagnosis not present

## 2022-07-13 DIAGNOSIS — F0394 Unspecified dementia, unspecified severity, with anxiety: Secondary | ICD-10-CM | POA: Diagnosis not present

## 2022-07-13 DIAGNOSIS — I129 Hypertensive chronic kidney disease with stage 1 through stage 4 chronic kidney disease, or unspecified chronic kidney disease: Secondary | ICD-10-CM | POA: Diagnosis not present

## 2022-07-13 DIAGNOSIS — Z7984 Long term (current) use of oral hypoglycemic drugs: Secondary | ICD-10-CM | POA: Diagnosis not present

## 2022-07-13 DIAGNOSIS — I4891 Unspecified atrial fibrillation: Secondary | ICD-10-CM

## 2022-07-13 DIAGNOSIS — N189 Chronic kidney disease, unspecified: Secondary | ICD-10-CM | POA: Diagnosis not present

## 2022-07-13 DIAGNOSIS — E1165 Type 2 diabetes mellitus with hyperglycemia: Secondary | ICD-10-CM | POA: Diagnosis not present

## 2022-07-13 DIAGNOSIS — Z7901 Long term (current) use of anticoagulants: Secondary | ICD-10-CM | POA: Diagnosis not present

## 2022-07-13 DIAGNOSIS — Z87891 Personal history of nicotine dependence: Secondary | ICD-10-CM | POA: Diagnosis not present

## 2022-07-13 LAB — POCT INR: INR: 2.9 (ref 2.0–3.0)

## 2022-07-13 NOTE — Patient Instructions (Signed)
Use warfarin 4mg  tablet and/or 6mg  tablet Continue warfarin 4mg  daily except 6mg  on Tuesdays, Thursdays and Saturdays Recheck in 1 wk by [facility].  Orders Given to [employer]

## 2022-07-13 NOTE — Telephone Encounter (Signed)
Called and left message on voice mail for pt to continue current dose of warfarin and [facility] will recheck in 1 week.  See coumadin note.

## 2022-07-13 NOTE — Telephone Encounter (Signed)
[facility] nurse wanted to let Dr. Eden Emms and team that his INR was 2.8

## 2022-07-14 DIAGNOSIS — I129 Hypertensive chronic kidney disease with stage 1 through stage 4 chronic kidney disease, or unspecified chronic kidney disease: Secondary | ICD-10-CM | POA: Diagnosis not present

## 2022-07-14 DIAGNOSIS — Z87891 Personal history of nicotine dependence: Secondary | ICD-10-CM | POA: Diagnosis not present

## 2022-07-14 DIAGNOSIS — F0390 Unspecified dementia without behavioral disturbance: Secondary | ICD-10-CM | POA: Diagnosis not present

## 2022-07-14 DIAGNOSIS — F0394 Unspecified dementia, unspecified severity, with anxiety: Secondary | ICD-10-CM | POA: Diagnosis not present

## 2022-07-14 DIAGNOSIS — H9190 Unspecified hearing loss, unspecified ear: Secondary | ICD-10-CM | POA: Diagnosis not present

## 2022-07-14 DIAGNOSIS — E1165 Type 2 diabetes mellitus with hyperglycemia: Secondary | ICD-10-CM | POA: Diagnosis not present

## 2022-07-14 DIAGNOSIS — I4891 Unspecified atrial fibrillation: Secondary | ICD-10-CM | POA: Diagnosis not present

## 2022-07-14 DIAGNOSIS — Z7984 Long term (current) use of oral hypoglycemic drugs: Secondary | ICD-10-CM | POA: Diagnosis not present

## 2022-07-14 DIAGNOSIS — E1122 Type 2 diabetes mellitus with diabetic chronic kidney disease: Secondary | ICD-10-CM | POA: Diagnosis not present

## 2022-07-14 DIAGNOSIS — N189 Chronic kidney disease, unspecified: Secondary | ICD-10-CM | POA: Diagnosis not present

## 2022-07-14 DIAGNOSIS — Z7901 Long term (current) use of anticoagulants: Secondary | ICD-10-CM | POA: Diagnosis not present

## 2022-07-15 DIAGNOSIS — N189 Chronic kidney disease, unspecified: Secondary | ICD-10-CM | POA: Diagnosis not present

## 2022-07-15 DIAGNOSIS — Z87891 Personal history of nicotine dependence: Secondary | ICD-10-CM | POA: Diagnosis not present

## 2022-07-15 DIAGNOSIS — E1122 Type 2 diabetes mellitus with diabetic chronic kidney disease: Secondary | ICD-10-CM | POA: Diagnosis not present

## 2022-07-15 DIAGNOSIS — Z7901 Long term (current) use of anticoagulants: Secondary | ICD-10-CM | POA: Diagnosis not present

## 2022-07-15 DIAGNOSIS — E1165 Type 2 diabetes mellitus with hyperglycemia: Secondary | ICD-10-CM | POA: Diagnosis not present

## 2022-07-15 DIAGNOSIS — H9190 Unspecified hearing loss, unspecified ear: Secondary | ICD-10-CM | POA: Diagnosis not present

## 2022-07-15 DIAGNOSIS — Z7984 Long term (current) use of oral hypoglycemic drugs: Secondary | ICD-10-CM | POA: Diagnosis not present

## 2022-07-15 DIAGNOSIS — F0394 Unspecified dementia, unspecified severity, with anxiety: Secondary | ICD-10-CM | POA: Diagnosis not present

## 2022-07-15 DIAGNOSIS — I129 Hypertensive chronic kidney disease with stage 1 through stage 4 chronic kidney disease, or unspecified chronic kidney disease: Secondary | ICD-10-CM | POA: Diagnosis not present

## 2022-07-15 DIAGNOSIS — F0390 Unspecified dementia without behavioral disturbance: Secondary | ICD-10-CM | POA: Diagnosis not present

## 2022-07-15 DIAGNOSIS — I4891 Unspecified atrial fibrillation: Secondary | ICD-10-CM | POA: Diagnosis not present

## 2022-07-21 ENCOUNTER — Ambulatory Visit
Admission: RE | Admit: 2022-07-21 | Discharge: 2022-07-21 | Disposition: A | Discharge status: Home / Self Care | Payer: PPO | Source: Ambulatory Visit | Attending: Physician Assistant | Admitting: Physician Assistant

## 2022-07-21 DIAGNOSIS — R413 Other amnesia: Secondary | ICD-10-CM | POA: Diagnosis not present

## 2022-07-21 DIAGNOSIS — I6782 Cerebral ischemia: Secondary | ICD-10-CM | POA: Diagnosis not present

## 2022-07-21 DIAGNOSIS — H903 Sensorineural hearing loss, bilateral: Secondary | ICD-10-CM | POA: Diagnosis not present

## 2022-07-21 DIAGNOSIS — H6123 Impacted cerumen, bilateral: Secondary | ICD-10-CM | POA: Diagnosis not present

## 2022-07-21 DIAGNOSIS — G319 Degenerative disease of nervous system, unspecified: Secondary | ICD-10-CM | POA: Diagnosis not present

## 2022-07-22 ENCOUNTER — Telehealth: Payer: Self-pay | Admitting: Cardiovascular Disease

## 2022-07-22 DIAGNOSIS — Z7901 Long term (current) use of anticoagulants: Secondary | ICD-10-CM | POA: Diagnosis not present

## 2022-07-22 DIAGNOSIS — Z87891 Personal history of nicotine dependence: Secondary | ICD-10-CM | POA: Diagnosis not present

## 2022-07-22 DIAGNOSIS — I129 Hypertensive chronic kidney disease with stage 1 through stage 4 chronic kidney disease, or unspecified chronic kidney disease: Secondary | ICD-10-CM | POA: Diagnosis not present

## 2022-07-22 DIAGNOSIS — E1122 Type 2 diabetes mellitus with diabetic chronic kidney disease: Secondary | ICD-10-CM | POA: Diagnosis not present

## 2022-07-22 DIAGNOSIS — N189 Chronic kidney disease, unspecified: Secondary | ICD-10-CM | POA: Diagnosis not present

## 2022-07-22 DIAGNOSIS — F0394 Unspecified dementia, unspecified severity, with anxiety: Secondary | ICD-10-CM | POA: Diagnosis not present

## 2022-07-22 DIAGNOSIS — H9190 Unspecified hearing loss, unspecified ear: Secondary | ICD-10-CM | POA: Diagnosis not present

## 2022-07-22 DIAGNOSIS — E1165 Type 2 diabetes mellitus with hyperglycemia: Secondary | ICD-10-CM | POA: Diagnosis not present

## 2022-07-22 DIAGNOSIS — Z7984 Long term (current) use of oral hypoglycemic drugs: Secondary | ICD-10-CM | POA: Diagnosis not present

## 2022-07-22 DIAGNOSIS — I4891 Unspecified atrial fibrillation: Secondary | ICD-10-CM | POA: Diagnosis not present

## 2022-07-22 DIAGNOSIS — F0390 Unspecified dementia without behavioral disturbance: Secondary | ICD-10-CM | POA: Diagnosis not present

## 2022-07-22 LAB — POCT INR: INR: 2.3 (ref 2.0–3.0)

## 2022-07-22 NOTE — Telephone Encounter (Signed)
[name] with [facility] called to report INR of 2.3 taken today.

## 2022-07-23 ENCOUNTER — Ambulatory Visit (INDEPENDENT_AMBULATORY_CARE_PROVIDER_SITE_OTHER): Payer: PPO | Admitting: Cardiology

## 2022-07-23 DIAGNOSIS — Z5181 Encounter for therapeutic drug level monitoring: Secondary | ICD-10-CM | POA: Diagnosis not present

## 2022-07-23 DIAGNOSIS — N189 Chronic kidney disease, unspecified: Secondary | ICD-10-CM | POA: Diagnosis not present

## 2022-07-23 DIAGNOSIS — Z7984 Long term (current) use of oral hypoglycemic drugs: Secondary | ICD-10-CM | POA: Diagnosis not present

## 2022-07-23 DIAGNOSIS — E1122 Type 2 diabetes mellitus with diabetic chronic kidney disease: Secondary | ICD-10-CM | POA: Diagnosis not present

## 2022-07-23 DIAGNOSIS — F0390 Unspecified dementia without behavioral disturbance: Secondary | ICD-10-CM | POA: Diagnosis not present

## 2022-07-23 DIAGNOSIS — E1165 Type 2 diabetes mellitus with hyperglycemia: Secondary | ICD-10-CM | POA: Diagnosis not present

## 2022-07-23 DIAGNOSIS — I129 Hypertensive chronic kidney disease with stage 1 through stage 4 chronic kidney disease, or unspecified chronic kidney disease: Secondary | ICD-10-CM | POA: Diagnosis not present

## 2022-07-23 DIAGNOSIS — Z87891 Personal history of nicotine dependence: Secondary | ICD-10-CM | POA: Diagnosis not present

## 2022-07-23 DIAGNOSIS — I4891 Unspecified atrial fibrillation: Secondary | ICD-10-CM

## 2022-07-23 DIAGNOSIS — H9190 Unspecified hearing loss, unspecified ear: Secondary | ICD-10-CM | POA: Diagnosis not present

## 2022-07-23 DIAGNOSIS — F0394 Unspecified dementia, unspecified severity, with anxiety: Secondary | ICD-10-CM | POA: Diagnosis not present

## 2022-07-23 DIAGNOSIS — Z7901 Long term (current) use of anticoagulants: Secondary | ICD-10-CM | POA: Diagnosis not present

## 2022-07-23 NOTE — Telephone Encounter (Signed)
Please see anti coag encounter from today for further documentation.  

## 2022-07-24 NOTE — Progress Notes (Signed)
Please place a referral to ENT on pt for L maxillary sinus disease, thanks

## 2022-07-24 NOTE — Progress Notes (Signed)
MRI brain shows some chronic mild circulation changes, no stroke or other acute findings. Mild age related atrophy seen. Recommend referral to ENT for sinusitis on the L , which is chronic. Thanks

## 2022-07-28 DIAGNOSIS — E1165 Type 2 diabetes mellitus with hyperglycemia: Secondary | ICD-10-CM | POA: Diagnosis not present

## 2022-07-28 DIAGNOSIS — Z87891 Personal history of nicotine dependence: Secondary | ICD-10-CM | POA: Diagnosis not present

## 2022-07-28 DIAGNOSIS — E1122 Type 2 diabetes mellitus with diabetic chronic kidney disease: Secondary | ICD-10-CM | POA: Diagnosis not present

## 2022-07-28 DIAGNOSIS — H9190 Unspecified hearing loss, unspecified ear: Secondary | ICD-10-CM | POA: Diagnosis not present

## 2022-07-28 DIAGNOSIS — N189 Chronic kidney disease, unspecified: Secondary | ICD-10-CM | POA: Diagnosis not present

## 2022-07-28 DIAGNOSIS — I4891 Unspecified atrial fibrillation: Secondary | ICD-10-CM | POA: Diagnosis not present

## 2022-07-28 DIAGNOSIS — Z7984 Long term (current) use of oral hypoglycemic drugs: Secondary | ICD-10-CM | POA: Diagnosis not present

## 2022-07-28 DIAGNOSIS — I129 Hypertensive chronic kidney disease with stage 1 through stage 4 chronic kidney disease, or unspecified chronic kidney disease: Secondary | ICD-10-CM | POA: Diagnosis not present

## 2022-07-28 DIAGNOSIS — F0394 Unspecified dementia, unspecified severity, with anxiety: Secondary | ICD-10-CM | POA: Diagnosis not present

## 2022-07-28 DIAGNOSIS — F0390 Unspecified dementia without behavioral disturbance: Secondary | ICD-10-CM | POA: Diagnosis not present

## 2022-07-28 DIAGNOSIS — Z7901 Long term (current) use of anticoagulants: Secondary | ICD-10-CM | POA: Diagnosis not present

## 2022-07-29 DIAGNOSIS — F0394 Unspecified dementia, unspecified severity, with anxiety: Secondary | ICD-10-CM | POA: Diagnosis not present

## 2022-07-29 DIAGNOSIS — H9190 Unspecified hearing loss, unspecified ear: Secondary | ICD-10-CM | POA: Diagnosis not present

## 2022-07-29 DIAGNOSIS — I4891 Unspecified atrial fibrillation: Secondary | ICD-10-CM | POA: Diagnosis not present

## 2022-07-29 DIAGNOSIS — Z87891 Personal history of nicotine dependence: Secondary | ICD-10-CM | POA: Diagnosis not present

## 2022-07-29 DIAGNOSIS — N189 Chronic kidney disease, unspecified: Secondary | ICD-10-CM | POA: Diagnosis not present

## 2022-07-29 DIAGNOSIS — E1122 Type 2 diabetes mellitus with diabetic chronic kidney disease: Secondary | ICD-10-CM | POA: Diagnosis not present

## 2022-07-29 DIAGNOSIS — E1165 Type 2 diabetes mellitus with hyperglycemia: Secondary | ICD-10-CM | POA: Diagnosis not present

## 2022-07-29 DIAGNOSIS — I129 Hypertensive chronic kidney disease with stage 1 through stage 4 chronic kidney disease, or unspecified chronic kidney disease: Secondary | ICD-10-CM | POA: Diagnosis not present

## 2022-07-29 DIAGNOSIS — Z7901 Long term (current) use of anticoagulants: Secondary | ICD-10-CM | POA: Diagnosis not present

## 2022-07-29 DIAGNOSIS — F0390 Unspecified dementia without behavioral disturbance: Secondary | ICD-10-CM | POA: Diagnosis not present

## 2022-07-29 DIAGNOSIS — Z7984 Long term (current) use of oral hypoglycemic drugs: Secondary | ICD-10-CM | POA: Diagnosis not present

## 2022-07-31 ENCOUNTER — Telehealth: Payer: Self-pay | Admitting: Cardiovascular Disease

## 2022-07-31 ENCOUNTER — Ambulatory Visit (INDEPENDENT_AMBULATORY_CARE_PROVIDER_SITE_OTHER): Payer: PPO

## 2022-07-31 DIAGNOSIS — I4891 Unspecified atrial fibrillation: Secondary | ICD-10-CM | POA: Diagnosis not present

## 2022-07-31 DIAGNOSIS — Z5181 Encounter for therapeutic drug level monitoring: Secondary | ICD-10-CM

## 2022-07-31 LAB — POCT INR: INR: 2.3 (ref 2.0–3.0)

## 2022-07-31 NOTE — Patient Instructions (Signed)
Description   Use warfarin 4mg  tablet and/or 6mg  tablet Spoke with Era Bumpers, RN [facility] and advised to have pt continue warfarin 4mg  daily except 6mg  on Tuesdays, Thursdays and Saturdays. Recheck in 2 weeks by [facility].  Orders Given to [employer]

## 2022-07-31 NOTE — Telephone Encounter (Signed)
Returned call. Please refer to anticoagulation encounter.

## 2022-07-31 NOTE — Telephone Encounter (Signed)
Caller is reporting INR results:         2.3 at 12:22pm

## 2022-08-20 ENCOUNTER — Telehealth: Payer: Self-pay | Admitting: Cardiovascular Disease

## 2022-08-20 ENCOUNTER — Ambulatory Visit (INDEPENDENT_AMBULATORY_CARE_PROVIDER_SITE_OTHER): Payer: PPO | Admitting: *Deleted

## 2022-08-20 DIAGNOSIS — I4891 Unspecified atrial fibrillation: Secondary | ICD-10-CM | POA: Diagnosis not present

## 2022-08-20 DIAGNOSIS — Z5181 Encounter for therapeutic drug level monitoring: Secondary | ICD-10-CM

## 2022-08-20 LAB — POCT INR: INR: 2.5 (ref 2.0–3.0)

## 2022-08-20 NOTE — Telephone Encounter (Signed)
Called and spoke with Tabitha RN [facility].  Coumadin instructions given.  See coumadin note.

## 2022-08-20 NOTE — Patient Instructions (Signed)
Using warfarin 4mg  tablet and/or 6mg  tablet Spoke with Tabitha, RN [facility] and advised to have pt continue warfarin 4mg  daily except 6mg  on Tuesdays, Thursdays and Saturdays. Recheck in 2 weeks by [facility].  Orders Given to [employer]

## 2022-08-20 NOTE — Telephone Encounter (Signed)
Tabitha from [facility] called to report patient's INR 2.5.

## 2022-08-26 ENCOUNTER — Other Ambulatory Visit: Payer: Self-pay | Admitting: Cardiovascular Disease

## 2022-09-08 ENCOUNTER — Ambulatory Visit: Payer: PPO | Admitting: Physician Assistant

## 2022-09-08 ENCOUNTER — Encounter: Payer: Self-pay | Admitting: Physician Assistant

## 2022-09-08 DIAGNOSIS — Z029 Encounter for administrative examinations, unspecified: Secondary | ICD-10-CM

## 2022-09-15 ENCOUNTER — Ambulatory Visit: Payer: PPO | Attending: Internal Medicine | Admitting: *Deleted

## 2022-09-15 DIAGNOSIS — I4891 Unspecified atrial fibrillation: Secondary | ICD-10-CM | POA: Diagnosis not present

## 2022-09-15 DIAGNOSIS — Z5181 Encounter for therapeutic drug level monitoring: Secondary | ICD-10-CM

## 2022-09-15 LAB — POCT INR: INR: 2.1 (ref 2.0–3.0)

## 2022-09-15 NOTE — Patient Instructions (Signed)
Using warfarin 4mg  tablet and/or 6mg  tablet Continue warfarin 4mg  daily except 6mg  on Tuesdays, Thursdays and Saturdays. Recheck in 4 weeks

## 2022-09-29 ENCOUNTER — Ambulatory Visit: Payer: PPO | Admitting: Physician Assistant

## 2022-09-29 ENCOUNTER — Encounter: Payer: Self-pay | Admitting: Physician Assistant

## 2022-09-29 VITALS — BP 114/68 | HR 82 | Resp 20 | Ht 70.0 in | Wt 219.0 lb

## 2022-09-29 DIAGNOSIS — R413 Other amnesia: Secondary | ICD-10-CM

## 2022-09-29 NOTE — Progress Notes (Signed)
Assessment/Plan:   Memory Impairment  [name] is a very pleasant 77 y.o. RH malewith a history of hypertension, hyperlipidemia, CKD, OSA not CPAP, HOH (does not want hearing aids)  permanent atrial fibrillation on Coumadin, hypothyroidism, increased glucocorticoid levels, recent hypercalcemia likely due to due to dehydration, DM 2 seen today in follow up for memory loss. Patient is currently on donepezil 10 mg daily, tolerating well. MRI of the brain personally reviewed showed chronic microvascular disease, without acute findings.  Mild age-related atrophy was noted. Patient is able to participate on his IADLs.     Follow up in 6  months. Continue donepezil 10 mg daily as per PCP. Continue CPAP for OSA Continue management of hypercalcemia as per PCP Recommend hearing test Recommend increasing activity Recommend good control of her cardiovascular risk factors Continue to control mood as per PCP    Subjective:    This patient is accompanied in the office by his stepdaughter who supplements the history.  Previous records as well as any outside records available were reviewed prior to todays visit.    Any changes in memory since last visit? " About the same".  He continues to have some difficulty remembering recent conversations or new information, or people's names that he just met.  He does not enjoy doing crossword puzzles or word finding.  He enjoys watching TV. repeats oneself?  Endorsed Disoriented when walking into a room?  Patient denies   Leaving objects in unusual places?  May misplace things but not in unusual places   Wandering behavior?  denies . He sits outside but does not wander off. Any personality changes since last visit?  denies   Any worsening depression?:  "He has less interaction and may be a little more depressed"-daughter reports. Hallucinations or paranoia? " I see people but I am aware that are not there" Seizures? denies    Any sleep changes? Not  sleeping well, not using the CPAP.  He endorses vivid dreams vivid dreams, occasionally  REM behavior, denies sleepwalking   Sleep apnea?  Endorsed not using CPAP nightly Any hygiene concerns? denies  Independent of bathing and dressing?  Endorsed  Does the patient needs help with medications?  Daughter is in charge   Who is in charge of the finances?  Patient is in charge, daughter monitors. Any changes in appetite?  Does not like to drink plain water but decreasing the sugar intake. He favors a whole pack of yogurt or a whole container of cream cheese over a real meal.  Patient have trouble swallowing? denies   Does the patient cook? No Any headaches?   denies   Chronic back pain  denies   Ambulates with difficulty?  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 Recent falls or head injuries? 2 months ago, "he fell in the kitchen with LOC  may have been his blood sugar." Unilateral weakness, numbness or tingling? denies   Any tremors?  denies   Any anosmia?  Patient denies   Any incontinence of urine?  Endorsed. Wears diapers Any bowel dysfunction?  denies      Patient lives with  stepdaughter  Does the patient drive? No longer drives     Initial visit 05/14/2022 How long did patient have memory difficulties? Over the last 2-3 years, worse over the last few weeks, when he noticed  difficulty remembering recent conversations, new information, and people names that he just met, "  but ok with people that he knows" . Stepdaughter noticed this weekend a big difference when he did not recognize her. Does not do crossword puzzles or word finding. Enjoys watching TV   repeats oneself?  Endorsed Disoriented when walking into a room?  Patient denies  Leaving objects in unusual places?  Occasionally losing stuff but not in unusual places    Wandering behavior? Denies, stepdaughter agrees    Any personality changes since last visit?  Endorsed,  he is more anxious than before. Any depression? 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 Also, has not been socializing with his friends as before and that affected him Hallucinations or paranoia?  Only when he thought his step-daughter was someone else recently.  Seizures?   denies    Any sleep changes?   He was napping more often, " and the doctor told him it was depression-stepdaughter says. "He is doing a little better lately. Has vivid dreams over the last 2 weeks, denies REM behavior or sleepwalking   Sleep apnea?  Endorsed, uses CPAP nightly Any hygiene concerns?  denies   Independent of bathing and dressing?  Endorsed  Does the patient need help with medications?  Daughter is in charge, after there were discrepancies on the doses of the medication and what he was ingesting  Who is in charge of the finances?  Patient is in charge     Any changes in appetite? Decreased over the last week     Patient have trouble swallowing?  denies   Does the patient cook?  No   Any headaches?  denies   Chronic back pain  denies   Ambulates with difficulty?  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  1 year ago he had a mechanical fall and hit his head, no LOC  Acute Unilateral weakness, numbness or tingling?  denies   Any tremors?  Endorsed, "better since Saturday, may be medicine related" Any anosmia?  denies   Any incontinence of urine?  denies   Any bowel dysfunction?    denies      Patient lives   with his stepdaughter.  He is widowed since 2019. history of heavy alcohol intake? denies   History of heavy tobacco use? denies   Family history of dementia? Denies  Dose patient drive? Not for the last week, "He is scared after his confusion "  PREVIOUS MEDICATIONS:   CURRENT MEDICATIONS:  Outpatient Encounter Medications as of 09/29/2022  Medication Sig   atorvastatin (LIPITOR) 20 MG tablet Take  20 mg by mouth daily.   diltiazem (CARDIZEM CD) 180 MG 24 hr capsule TAKE ONE CAPSULE BY MOUTH DAILY. (Patient taking differently: Take 180 mg by mouth daily.)   donepezil (ARICEPT) 5 MG tablet Take 10 mg by mouth daily.   doxycycline (VIBRAMYCIN) 100 MG capsule Take 100 mg by mouth 2 (two) times daily.   levothyroxine (SYNTHROID) 25 MCG tablet Take 25 mcg by mouth daily before breakfast.    lisinopril (PRINIVIL,ZESTRIL) 20 MG tablet Take 20 mg by mouth 2 (two) times daily.    metFORMIN (GLUCOPHAGE) 500 MG tablet Take 500 mg by mouth 2 (two) times daily with a meal.   metoprolol succinate (TOPROL-XL) 50 MG 24 hr tablet Take 1 tablet (50 mg total) by mouth daily.   Vitamin D, Ergocalciferol, (DRISDOL) 1.25 MG (50000 UNIT) CAPS capsule Take by mouth every 7 (seven) days.  warfarin (COUMADIN) 4 MG tablet TAKE 1 TABLET BY MOUTH DAILY. EXCEPT 1 1/2 TABLETS ON SUNDAYS. (Patient taking differently: 2 mg. Takes 4mg  daily)   No facility-administered encounter medications on file as of 09/29/2022.        No data to display            05/14/2022    8:00 AM  Montreal Cognitive Assessment   Visuospatial/ Executive (0/5) 0  Naming (0/3) 2  Attention: Read list of digits (0/2) 2  Attention: Read list of letters (0/1) 1  Attention: Serial 7 subtraction starting at 100 (0/3) 3  Language: Repeat phrase (0/2) 1  Language : Fluency (0/1) 0  Abstraction (0/2) 1  Delayed Recall (0/5) 5  Orientation (0/6) 3  Total 18  Adjusted Score (based on education) 19    Objective:     PHYSICAL EXAMINATION:    VITALS:   Vitals:   09/29/22 1248  Height: 5\' 10"  (1.778 m)    GEN:  The patient appears stated age and is in NAD. HEENT:  Normocephalic, atraumatic.   Neurological examination:  General: NAD, well-groomed, appears stated age. Orientation: The patient is alert. Oriented to person, place and not to date. Cranial nerves: There is good facial symmetry.The speech is fluent and clear. No aphasia  or dysarthria. Fund of knowledge is appropriate. Recent memory impaired, remote memory normal Attention and concentration are reduced.  Able to name objects and repeat phrases.  Hearing is reduced to conversational tone.  Sensation: Sensation is intact to light touch throughout Motor: He has residual left leg and foot weakness postpolio.  Strength is otherwise 5/5 at least antigravity x4. DTR's 2/4 in UE/LE     Movement examination: Tone: There is normal tone in the UE/LE Abnormal movements:  no tremor.  No myoclonus.  No asterixis.   Coordination:  There is no decremation with RAM's. Normal finger to nose  Gait and Station: The patient has no difficulty arising out of a deep-seated chair without the use of the hands. The patient's stride length is good.  Gait is cautious and narrow.    Thank you for allowing [place] the opportunity to participate in the care of this nice patient. Please do not hesitate to contact us for any questions or concerns.   Total time spent on today's visit was 36 minutes dedicated to this patient today, preparing to see patient, examining the patient, ordering tests and/or medications and counseling the patient, documenting clinical information in the EHR or other health record, independently interpreting results and communicating results to the patient/family, discussing treatment and goals, answering patient's questions and coordinating care.  Cc:  Elfredia Nevins, MD  Marlowe Kays 09/29/2022 12:57 PM

## 2022-09-29 NOTE — Patient Instructions (Addendum)
It was a pleasure to see you today at our office.   Recommendations:   Resume CPAP   Continue taking donepezil 10  mg daily, follow with PCP  Recommend hearing testing  Increase activity  Folllow up  in 6  months  Whom to call:  Memory  decline, memory medications: Call our office [phone]   For psychiatric meds, mood meds: Please have your primary care physician manage these medications.    For assessment of decision of mental capacity and competency:  Call Dr. Erick Blinks, geriatric psychiatrist at [phone]     If you have any severe symptoms of a stroke, or other severe issues such as confusion,severe chills or fever, etc call 911 or go to the ER as you may need to be evaluated further   Feel free to visit Facebook page " [email or web address]" for tips of how to care for people with memory problems.        RECOMMENDATIONS FOR ALL PATIENTS WITH MEMORY PROBLEMS: 1. Continue to exercise (Recommend 30 minutes of walking everyday, or 3 hours every week) 2. Increase social interactions - continue going to [place] and enjoy social gatherings with friends and family 3. Eat healthy, avoid fried foods and eat more fruits and vegetables 4. Maintain adequate blood pressure, blood sugar, and blood cholesterol level. Reducing the risk of stroke and cardiovascular disease also helps promoting better memory. 5. Avoid stressful situations. Live a simple life and avoid aggravations. Organize your time and prepare for the next day in anticipation. 6. Sleep well, avoid any interruptions of sleep and avoid any distractions in the bedroom that may interfere with adequate sleep quality 7. Avoid sugar, avoid sweets as there is a strong link between excessive sugar intake, diabetes, and cognitive impairment We discussed the Mediterranean diet, which has been shown to help patients reduce the risk of progressive memory disorders and reduces cardiovascular risk. This includes eating fish, eat fruits and  green leafy vegetables, nuts like almonds and hazelnuts, walnuts, and also use olive oil. Avoid fast foods and fried foods as much as possible. Avoid sweets and sugar as sugar use has been linked to worsening of memory function.  There is always a concern of gradual progression of memory problems. If this is the case, then we may need to adjust level of care according to patient needs. Support, both to the patient and caregiver, should then be put into place.      You have been referred for a neuropsychological evaluation (i.e., evaluation of memory and thinking abilities). Please bring someone with you to this appointment if possible, as it is helpful for the doctor to hear from both you and another adult who knows you well. Please bring eyeglasses and hearing aids if you wear them.    The evaluation will take approximately 3 hours and has two parts:   The first part is a clinical interview with the neuropsychologist (Dr. Milbert Coulter or Dr. Roseanne Reno). During the interview, the neuropsychologist will speak with you and the individual you brought to the appointment.    The second part of the evaluation is testing with the doctor's technician Annabelle Harman or Selena Batten). During the testing, the technician will ask you to remember different types of material, solve problems, and answer some questionnaires. Your family member will not be present for this portion of the evaluation.   Please note: We must reserve several hours of the neuropsychologist's time and the psychometrician's time for your evaluation appointment. As such, there is a No-Show  fee of $100. If you are unable to attend any of your appointments, please contact our office as soon as possible to reschedule.    FALL PRECAUTIONS: Be cautious when walking. Scan the area for obstacles that may increase the risk of trips and falls. When getting up in the mornings, sit up at the edge of the bed for a few minutes before getting out of bed. Consider elevating the bed  at the head end to avoid drop of blood pressure when getting up. Walk always in a well-lit room (use night lights in the walls). Avoid area rugs or power cords from appliances in the middle of the walkways. Use a walker or a cane if necessary and consider physical therapy for balance exercise. Get your eyesight checked regularly.  FINANCIAL OVERSIGHT: Supervision, especially oversight when making financial decisions or transactions is also recommended.  HOME SAFETY: Consider the safety of the kitchen when operating appliances like stoves, microwave oven, and blender. Consider having supervision and share cooking responsibilities until no longer able to participate in those. Accidents with firearms and other hazards in the house should be identified and addressed as well.   ABILITY TO BE LEFT ALONE: If patient is unable to contact 911 operator, consider using LifeLine, or when the need is there, arrange for someone to stay with patients. Smoking is a fire hazard, consider supervision or cessation. Risk of wandering should be assessed by caregiver and if detected at any point, supervision and safe proof recommendations should be instituted.  MEDICATION SUPERVISION: Inability to self-administer medication needs to be constantly addressed. Implement a mechanism to ensure safe administration of the medications.   DRIVING: Regarding driving, in patients with progressive memory problems, driving will be impaired. We advise to have someone else do the driving if trouble finding directions or if minor accidents are reported. Independent driving assessment is available to determine safety of driving.   If you are interested in the driving assessment, you can contact the following:  [employer] in [place] [phone]  [facility] [phone]  [facility] [phone] or [phone]    Mediterranean Diet A Mediterranean  diet refers to food and lifestyle choices that are based on the traditions of countries located on the [employer]. This way of eating has been shown to help prevent certain conditions and improve outcomes for people who have chronic diseases, like kidney disease and heart disease. What are tips for following this plan? Lifestyle  Cook and eat meals together with your family, when possible. Drink enough fluid to keep your urine clear or pale yellow. Be physically active every day. This includes: Aerobic exercise like running or swimming. Leisure activities like gardening, walking, or housework. Get 7-8 hours of sleep each night. If recommended by your health care provider, drink red wine in moderation. This means 1 glass a day for nonpregnant women and 2 glasses a day for men. A glass of wine equals 5 oz (150 mL). Reading food labels  Check the serving size of packaged foods. For foods such as rice and pasta, the serving size refers to the amount of cooked product, not dry. Check the total fat in packaged foods. Avoid foods that have saturated fat or trans fats. Check the ingredients list for added sugars, such as corn syrup. Shopping  At the grocery store, buy most of your food from the areas near the walls of the store. This includes: Fresh fruits and vegetables (produce). Grains, beans, nuts,  and seeds. Some of these may be available in unpackaged forms or large amounts (in bulk). Fresh seafood. Poultry and eggs. Low-fat dairy products. Buy whole ingredients instead of prepackaged foods. Buy fresh fruits and vegetables in-season from local farmers markets. Buy frozen fruits and vegetables in resealable bags. If you do not have access to quality fresh seafood, buy precooked frozen shrimp or canned fish, such as tuna, salmon, or sardines. Buy small amounts of raw or cooked vegetables, salads, or olives from the deli or salad bar at your store. Stock your pantry so you always have  certain foods on hand, such as olive oil, canned tuna, canned tomatoes, rice, pasta, and beans. Cooking  Cook foods with extra-virgin olive oil instead of using butter or other vegetable oils. Have meat as a side dish, and have vegetables or grains as your main dish. This means having meat in small portions or adding small amounts of meat to foods like pasta or stew. Use beans or vegetables instead of meat in common dishes like chili or lasagna. Experiment with different cooking methods. Try roasting or broiling vegetables instead of steaming or sauteing them. Add frozen vegetables to soups, stews, pasta, or rice. Add nuts or seeds for added healthy fat at each meal. You can add these to yogurt, salads, or vegetable dishes. Marinate fish or vegetables using olive oil, lemon juice, garlic, and fresh herbs. Meal planning  Plan to eat 1 vegetarian meal one day each week. Try to work up to 2 vegetarian meals, if possible. Eat seafood 2 or more times a week. Have healthy snacks readily available, such as: Vegetable sticks with hummus. Greek yogurt. Fruit and nut trail mix. Eat balanced meals throughout the week. This includes: Fruit: 2-3 servings a day Vegetables: 4-5 servings a day Low-fat dairy: 2 servings a day Fish, poultry, or lean meat: 1 serving a day Beans and legumes: 2 or more servings a week Nuts and seeds: 1-2 servings a day Whole grains: 6-8 servings a day Extra-virgin olive oil: 3-4 servings a day Limit red meat and sweets to only a few servings a month What are my food choices? Mediterranean diet Recommended Grains: Whole-grain pasta. Brown rice. Bulgar wheat. Polenta. Couscous. Whole-wheat bread. Orpah Cobb. Vegetables: Artichokes. Beets. Broccoli. Cabbage. Carrots. Eggplant. Green beans. Chard. Kale. Spinach. Onions. Leeks. Peas. Squash. Tomatoes. Peppers. Radishes. Fruits: Apples. Apricots. Avocado. Berries. Bananas. Cherries. Dates. Figs. Grapes. Lemons. Melon.  Oranges. Peaches. Plums. Pomegranate. Meats and other protein foods: Beans. Almonds. Sunflower seeds. Pine nuts. Peanuts. Cod. Salmon. Scallops. Shrimp. Tuna. Tilapia. Clams. Oysters. Eggs. Dairy: Low-fat milk. Cheese. Greek yogurt. Beverages: Water. Red wine. Herbal tea. Fats and oils: Extra virgin olive oil. Avocado oil. Grape seed oil. Sweets and desserts: [place] yogurt with honey. Baked apples. Poached pears. Trail mix. Seasoning and other foods: Basil. Cilantro. Coriander. Cumin. Mint. Parsley. Sage. Rosemary. Tarragon. Garlic. Oregano. Thyme. Pepper. Balsalmic vinegar. Tahini. Hummus. Tomato sauce. Olives. Mushrooms. Limit these Grains: Prepackaged pasta or rice dishes. Prepackaged cereal with added sugar. Vegetables: Deep fried potatoes (french fries). Fruits: Fruit canned in syrup. Meats and other protein foods: Beef. Pork. Lamb. Poultry with skin. Hot dogs. Tomasa Blase. Dairy: Ice cream. Sour cream. Whole milk. Beverages: Juice. Sugar-sweetened soft drinks. Beer. Liquor and spirits. Fats and oils: Butter. Canola oil. Vegetable oil. Beef fat (tallow). Lard. Sweets and desserts: Cookies. Cakes. Pies. Candy. Seasoning and other foods: Mayonnaise. Premade sauces and marinades. The items listed may not be a complete list. Talk with your dietitian about what dietary  choices are right for you. Summary The Mediterranean diet includes both food and lifestyle choices. Eat a variety of fresh fruits and vegetables, beans, nuts, seeds, and whole grains. Limit the amount of red meat and sweets that you eat. Talk with your health care provider about whether it is safe for you to drink red wine in moderation. This means 1 glass a day for nonpregnant women and 2 glasses a day for men. A glass of wine equals 5 oz (150 mL). This information is not intended to replace advice given to you by your health care provider. Make sure you discuss any questions you have with your health care provider. Document  Released: 11/07/2015 Document Revised: 12/10/2015 Document Reviewed: 11/07/2015 Elsevier Interactive Patient Education  [employer].   Labs today [address] [facility] [phone]

## 2022-11-17 ENCOUNTER — Encounter: Payer: Self-pay | Admitting: Cardiology

## 2022-11-17 ENCOUNTER — Ambulatory Visit: Payer: PPO | Attending: Cardiology | Admitting: Cardiology

## 2022-11-17 VITALS — BP 106/68 | HR 89 | Ht 70.0 in | Wt 219.8 lb

## 2022-11-17 DIAGNOSIS — E782 Mixed hyperlipidemia: Secondary | ICD-10-CM | POA: Diagnosis not present

## 2022-11-17 DIAGNOSIS — I429 Cardiomyopathy, unspecified: Secondary | ICD-10-CM | POA: Diagnosis not present

## 2022-11-17 DIAGNOSIS — G4733 Obstructive sleep apnea (adult) (pediatric): Secondary | ICD-10-CM | POA: Diagnosis not present

## 2022-11-17 DIAGNOSIS — E039 Hypothyroidism, unspecified: Secondary | ICD-10-CM

## 2022-11-17 DIAGNOSIS — I10 Essential (primary) hypertension: Secondary | ICD-10-CM | POA: Diagnosis not present

## 2022-11-17 DIAGNOSIS — I4821 Permanent atrial fibrillation: Secondary | ICD-10-CM | POA: Diagnosis not present

## 2022-11-17 DIAGNOSIS — Z5181 Encounter for therapeutic drug level monitoring: Secondary | ICD-10-CM | POA: Diagnosis not present

## 2022-11-17 NOTE — Progress Notes (Signed)
Cardiology Office Note:  .   Date:  11/17/2022  ID:  [name], DOB February 02, 1946, MRN [id number] PCP: Elfredia Nevins, MD  [facility] Providers Cardiologist:  Charlton Haws, MD    History of Present Illness: .   [name] is a 77 y.o. male with a past medical history of cardiomyopathy, permanent atrial fibrillation, hyperlipidemia, hypertension type 2 diabetes, and hypothyroidism, who presents today for follow-up.  Echocardiogram in 05/06/2017 demonstrated normalization of left ventricular systolic function with an LVEF of 55 to 60%, mild LVH, previous LVEF was 40-45% in 2015.  Repeat echocardiogram in 08/2021 revealed LVEF 70 to 75%, no RWMA, mild LVH, trivial MR.  He was last seen in clinic 09/02/2021 by Dr.Nishan.  At that time his activity been limited by leg weakness from history of polio and was continuing to use his cane to ambulate.  He had also continued on Coumadin therapy without any issues of bleeding.  He was scheduled for an updated echocardiogram that was completed and further discussion was had with the newer DOAC but the patient preferred to remain on Coumadin therapy.  There were no other medication changes that were made.  He returns to clinic today stating that for the past part he has been doing fairly well.  He stated that his memory and his dementia is taking a toll on him.  His daughter went to work yesterday and he got in the car and drove around for approximately 8 hours he said he had been $100 with a gas in his car and then after his daughter got off work she proceeded to take his car keys away from him and has advised him he will no longer be driving and even drove him to his appointment today.  He denies any chest pain or shortness of breath.  Continues to have pain to his knees from arthritis and is ambulating with a cane today.  Denies any bleeding or noted any blood in his urine or stool as he has been continued on Coumadin therapy.  He states he does  occasionally suffer from diarrhea and frequent urination.  He also has been sleeping on the couch is stating that he is no longer wearing CPAP as there is no plug in the living room for it.  Denies any recent hospitalizations or visits to the emergency department  ROS: 10 point review of system has been reviewed and considered negative with exception of what is been listed in the HPI  Studies Reviewed: Marland Kitchen   EKG Interpretation Date/Time:  Tuesday November 17 2022 13:03:33 EDT Ventricular Rate:  111 PR Interval:    QRS Duration:  70 QT Interval:  316 QTC Calculation: 429 R Axis:   -18  Text Interpretation: Atrial fibrillation with rapid ventricular response with premature ventricular or aberrantly conducted complexes Anterior infarct (cited on or before 01-Feb-2014) When compared with ECG of 10-May-2014 09:26, Nonspecific T wave abnormality has replaced inverted T waves in Inferior leads Confirmed by Charlsie Quest ([id number]) on 11/17/2022 1:05:09 PM   TTE 09/12/2021 1. Left ventricular ejection fraction, by estimation, is 70 to 75%. The  left ventricle has hyperdynamic function. The left ventricle has no  regional wall motion abnormalities. There is mild left ventricular  hypertrophy. Left ventricular diastolic  parameters are indeterminate.   2. Right ventricular systolic function is normal. The right ventricular  size is mildly enlarged.   3. Left atrial size was moderately dilated.   4. The mitral valve is normal in  structure. Trivial mitral valve  regurgitation.   5. The aortic valve is tricuspid. Aortic valve regurgitation is not  visualized. Aortic valve sclerosis is present, with no evidence of aortic  valve stenosis.   6. The inferior vena cava is normal in size with greater than 50%  respiratory variability, suggesting right atrial pressure of 3 mmHg.   Risk Assessment/Calculations:    CHA2DS2-VASc Score = 5   This indicates a 7.2% annual risk of stroke. The patient's score is  based upon: CHF History: 1 HTN History: 1 Diabetes History: 1 Stroke History: 0 Vascular Disease History: 0 Age Score: 2 Gender Score: 0            Physical Exam:   VS:  BP 90/60   Ht 5\' 10"  (1.778 m)   Wt 219 lb 12.8 oz (99.7 kg)   SpO2 95%   BMI 31.54 kg/m    Wt Readings from Last 3 Encounters:  11/17/22 219 lb 12.8 oz (99.7 kg)  09/29/22 219 lb (99.3 kg)  06/16/22 216 lb 3.2 oz (98.1 kg)    GEN: Well nourished, well developed in no acute distress NECK: No JVD; No carotid bruits CARDIAC: IR IR, no murmurs, rubs, gallops RESPIRATORY:  Clear to auscultation without rales, wheezing or rhonchi  ABDOMEN: Soft, non-tender, non-distended EXTREMITIES:  No edema; No deformity   ASSESSMENT AND PLAN: .   Permanent atrial fibrillation with his heart rate that is rate controlled.  He is continued on Toprol-XL 50 mg daily, diltiazem 180 mg daily and warfarin which is last INR of 2.1 which remains at goal they continues to be monitored by his PCP.  Previously had discussion about DOAC the patient prefers to remain on Coumadin.  EKG today reveals rate controlled atrial fibrillation with no ischemic changes noted.  Hypertension with blood pressure today 90/60 with recheck of 106/68.  Blood pressure has remained stable.  He is continued on his current medication regimen without changes of diltiazem, lisinopril, metoprolol .he has been encouraged to continue to monitor his pressure at home as well.  Cardiomyopathy etiology was never defined by CT or cath.  Questionable rate related to his atrial fibrillation but EF had normalized by echocardiogram in 2019.  Repeat echocardiogram in 2023 revealed an LVEF of 70-75%, no RWMA, trivial mitral regurgitation, Patient is continued on Toprol-XL  Hyperlipidemia where he is continued on atorvastatin 20 mg daily.  There is no updated lipid panel found in his record he will be sent for updated lipid panel today.  Hypothyroidism where his most recent TSH of  3.61.  He is continued on Synthroid.  This continues to be managed by his PCP.  Type 2 diabetes where he is continued on metformin.  Target A1c 6.5 or less.  States that he has on a strict dietary regimen as he lives with his stepdaughter and she controls meals.  Obstructive sleep apnea had previously had been compliant with CPAP but he is no longer states that he has not used his machine in quite some time.  Encouraged the use of CPAP.       Dispo: Patient return to clinic to see MD/APP in 1 year or sooner if needed  Signed, Brieann Osinski, NP

## 2022-11-17 NOTE — Patient Instructions (Signed)
Medication Instructions:  Your physician recommends that you continue on your current medications as directed. Please refer to the Current Medication list given to you today.  *If you need a refill on your cardiac medications before your next appointment, please call your pharmacy*   Lab Work: Your physician recommends that you return for lab work in: Fasting   If you have labs (blood work) drawn today and your tests are completely normal, you will receive your results only by: MyChart Message (if you have MyChart) OR A paper copy in the mail If you have any lab test that is abnormal or we need to change your treatment, we will call you to review the results.   Testing/Procedures: NONE    Follow-Up: At [facility], you and your health needs are our priority.  As part of our continuing mission to provide you with exceptional heart care, we have created designated Provider Care Teams.  These Care Teams include your primary Cardiologist (physician) and Advanced Practice Providers (APPs -  Physician Assistants and Nurse Practitioners) who all work together to provide you with the care you need, when you need it.  We recommend signing up for the patient portal called "MyChart".  Sign up information is provided on this After Visit Summary.  MyChart is used to connect with patients for Virtual Visits (Telemedicine).  Patients are able to view lab/test results, encounter notes, upcoming appointments, etc.  Non-urgent messages can be sent to your provider as well.   To learn more about what you can do with MyChart, go to [email or web address].    Your next appointment:   1 year(s)  Provider:   You may see Charlton Haws, MD or one of the following Advanced Practice Providers on your designated Care Team:   Randall An, PA-C  Jacolyn Reedy, PA-C     Other Instructions Thank you for choosing [facility]!

## 2022-12-15 ENCOUNTER — Ambulatory Visit: Payer: PPO | Admitting: "Endocrinology

## 2022-12-24 DIAGNOSIS — Z6832 Body mass index (BMI) 32.0-32.9, adult: Secondary | ICD-10-CM | POA: Diagnosis not present

## 2022-12-24 DIAGNOSIS — I4891 Unspecified atrial fibrillation: Secondary | ICD-10-CM | POA: Diagnosis not present

## 2022-12-24 DIAGNOSIS — I129 Hypertensive chronic kidney disease with stage 1 through stage 4 chronic kidney disease, or unspecified chronic kidney disease: Secondary | ICD-10-CM | POA: Diagnosis not present

## 2022-12-24 DIAGNOSIS — N189 Chronic kidney disease, unspecified: Secondary | ICD-10-CM | POA: Diagnosis not present

## 2022-12-24 DIAGNOSIS — E6609 Other obesity due to excess calories: Secondary | ICD-10-CM | POA: Diagnosis not present

## 2022-12-24 DIAGNOSIS — E1165 Type 2 diabetes mellitus with hyperglycemia: Secondary | ICD-10-CM | POA: Diagnosis not present

## 2022-12-24 DIAGNOSIS — E1122 Type 2 diabetes mellitus with diabetic chronic kidney disease: Secondary | ICD-10-CM | POA: Diagnosis not present

## 2022-12-24 DIAGNOSIS — E1129 Type 2 diabetes mellitus with other diabetic kidney complication: Secondary | ICD-10-CM | POA: Diagnosis not present

## 2022-12-28 ENCOUNTER — Ambulatory Visit: Payer: PPO | Attending: Internal Medicine | Admitting: *Deleted

## 2022-12-28 DIAGNOSIS — I4891 Unspecified atrial fibrillation: Secondary | ICD-10-CM | POA: Diagnosis not present

## 2022-12-28 DIAGNOSIS — Z5181 Encounter for therapeutic drug level monitoring: Secondary | ICD-10-CM

## 2022-12-28 LAB — POCT INR: INR: 2.2 (ref 2.0–3.0)

## 2022-12-28 NOTE — Patient Instructions (Signed)
Using warfarin 4mg  tablet and/or 6mg  tablet Continue warfarin 4mg  daily except 6mg  on Tuesdays, Thursdays and Saturdays. Recheck in 6 weeks

## 2023-02-08 ENCOUNTER — Ambulatory Visit: Payer: PPO

## 2023-02-08 ENCOUNTER — Encounter: Payer: Self-pay | Admitting: *Deleted

## 2023-02-15 ENCOUNTER — Ambulatory Visit: Payer: PPO | Attending: Cardiology

## 2023-03-16 DIAGNOSIS — E1122 Type 2 diabetes mellitus with diabetic chronic kidney disease: Secondary | ICD-10-CM | POA: Diagnosis not present

## 2023-03-16 DIAGNOSIS — G8929 Other chronic pain: Secondary | ICD-10-CM | POA: Diagnosis not present

## 2023-03-16 DIAGNOSIS — Z1331 Encounter for screening for depression: Secondary | ICD-10-CM | POA: Diagnosis not present

## 2023-03-16 DIAGNOSIS — E559 Vitamin D deficiency, unspecified: Secondary | ICD-10-CM | POA: Diagnosis not present

## 2023-03-16 DIAGNOSIS — Z7901 Long term (current) use of anticoagulants: Secondary | ICD-10-CM | POA: Diagnosis not present

## 2023-03-16 DIAGNOSIS — F0390 Unspecified dementia without behavioral disturbance: Secondary | ICD-10-CM | POA: Diagnosis not present

## 2023-03-16 DIAGNOSIS — E1129 Type 2 diabetes mellitus with other diabetic kidney complication: Secondary | ICD-10-CM | POA: Diagnosis not present

## 2023-03-16 DIAGNOSIS — Z125 Encounter for screening for malignant neoplasm of prostate: Secondary | ICD-10-CM | POA: Diagnosis not present

## 2023-03-16 DIAGNOSIS — E6609 Other obesity due to excess calories: Secondary | ICD-10-CM | POA: Diagnosis not present

## 2023-03-16 DIAGNOSIS — Z6834 Body mass index (BMI) 34.0-34.9, adult: Secondary | ICD-10-CM | POA: Diagnosis not present

## 2023-03-16 DIAGNOSIS — D518 Other vitamin B12 deficiency anemias: Secondary | ICD-10-CM | POA: Diagnosis not present

## 2023-03-16 DIAGNOSIS — E1140 Type 2 diabetes mellitus with diabetic neuropathy, unspecified: Secondary | ICD-10-CM | POA: Diagnosis not present

## 2023-03-16 DIAGNOSIS — Z79899 Other long term (current) drug therapy: Secondary | ICD-10-CM | POA: Diagnosis not present

## 2023-03-16 DIAGNOSIS — Z0001 Encounter for general adult medical examination with abnormal findings: Secondary | ICD-10-CM | POA: Diagnosis not present

## 2023-03-16 DIAGNOSIS — E1165 Type 2 diabetes mellitus with hyperglycemia: Secondary | ICD-10-CM | POA: Diagnosis not present

## 2023-03-16 DIAGNOSIS — I129 Hypertensive chronic kidney disease with stage 1 through stage 4 chronic kidney disease, or unspecified chronic kidney disease: Secondary | ICD-10-CM | POA: Diagnosis not present

## 2023-03-16 DIAGNOSIS — G9332 Myalgic encephalomyelitis/chronic fatigue syndrome: Secondary | ICD-10-CM | POA: Diagnosis not present

## 2023-03-28 ENCOUNTER — Other Ambulatory Visit: Payer: Self-pay | Admitting: Cardiology

## 2023-04-06 ENCOUNTER — Ambulatory Visit: Payer: PPO | Admitting: Physician Assistant

## 2023-04-12 DIAGNOSIS — E1122 Type 2 diabetes mellitus with diabetic chronic kidney disease: Secondary | ICD-10-CM | POA: Diagnosis not present

## 2023-04-12 DIAGNOSIS — I129 Hypertensive chronic kidney disease with stage 1 through stage 4 chronic kidney disease, or unspecified chronic kidney disease: Secondary | ICD-10-CM | POA: Diagnosis not present

## 2023-04-12 DIAGNOSIS — N189 Chronic kidney disease, unspecified: Secondary | ICD-10-CM | POA: Diagnosis not present

## 2023-04-12 DIAGNOSIS — I4891 Unspecified atrial fibrillation: Secondary | ICD-10-CM | POA: Diagnosis not present

## 2023-04-27 ENCOUNTER — Ambulatory Visit: Payer: PPO | Admitting: Physician Assistant

## 2023-06-11 DIAGNOSIS — Z79899 Other long term (current) drug therapy: Secondary | ICD-10-CM | POA: Diagnosis not present

## 2023-06-11 DIAGNOSIS — E1122 Type 2 diabetes mellitus with diabetic chronic kidney disease: Secondary | ICD-10-CM | POA: Diagnosis not present

## 2023-06-11 DIAGNOSIS — E875 Hyperkalemia: Secondary | ICD-10-CM | POA: Diagnosis not present

## 2023-07-06 ENCOUNTER — Ambulatory Visit (INDEPENDENT_AMBULATORY_CARE_PROVIDER_SITE_OTHER): Payer: PPO

## 2023-07-06 ENCOUNTER — Encounter (INDEPENDENT_AMBULATORY_CARE_PROVIDER_SITE_OTHER): Payer: Self-pay

## 2023-07-06 VITALS — BP 120/64 | HR 97 | Ht 70.0 in | Wt 220.0 lb

## 2023-07-06 DIAGNOSIS — H903 Sensorineural hearing loss, bilateral: Secondary | ICD-10-CM | POA: Diagnosis not present

## 2023-07-06 DIAGNOSIS — H6123 Impacted cerumen, bilateral: Secondary | ICD-10-CM | POA: Diagnosis not present

## 2023-07-06 NOTE — Progress Notes (Unsigned)
 Patient ID: [name], male   DOB: [birth year], 78 y.o.   MRN: [id number]  Follow up: Hearing loss  HPI: The patient is a 78 year old male who returns today with his daughter for his follow-up evaluation.  The patient has a history of bilateral sensorineural hearing loss.  He was last seen 1 year ago.  At that time, he was noted to have bilateral high-frequency sensorineural hearing loss, likely secondary to presbycusis.  He was fitted with bilateral hearing aids.  According to the daughter, the patient benefited from the use of his hearing aids.  However, he has lost 2 sets of hearing aids over the past year.  He has been without his hearing aids for the past few months.  Currently he denies any otalgia, otorrhea, or vertigo.  He denies any change in his hearing.  Exam: General: Communicates without difficulty, well nourished, no acute distress. Head: Normocephalic, no evidence injury, no tenderness, facial buttresses intact without stepoff. Face/sinus: No tenderness to palpation and percussion. Facial movement is normal and symmetric. Eyes: PERRL, EOMI. No scleral icterus, conjunctivae clear. Neuro: CN II exam reveals vision grossly intact.  No nystagmus at any point of gaze. Ears: Auricles well formed without lesions.  Bilateral cerumen impaction.  Nose: Dorsum is intact.  Anterior rhinoscopy reveals congested mucosa over anterior aspect of inferior turbinates and intact septum.  No purulence noted. Oral:  Oral cavity and oropharynx are intact, symmetric, without erythema or edema.  Mucosa is moist without lesions. Neck: Full range of motion without pain.  There is no significant lymphadenopathy.  No masses palpable.  Thyroid bed within normal limits to palpation.  Parotid glands and submandibular glands equal bilaterally without mass.  Trachea is midline. Neuro:  CN 2-12 grossly intact.   Procedure: Bilateral cerumen disimpaction Anesthesia: None Description: Under the operating microscope, the  cerumen is carefully removed with a combination of cerumen currette, alligator forceps, and suction catheters.  After the cerumen is removed, the TMs are noted to be normal.  No mass, erythema, or lesions. The patient tolerated the procedure well.    Assessment: 1.  Incidental finding of bilateral cerumen impaction.  After the cerumen disimpaction procedure, both tympanic membranes and middle ear spaces are noted to be normal. 2.  Subjectively stable bilateral high-frequency sensorineural hearing loss.  The patient has lost 2 sets of hearing aids over the past year.  Plan: 1.  Otomicroscopy with bilateral cerumen disimpaction. 2.  The physical exam findings are reviewed with the patient and his daughter. 3.  Based on the above findings, the patient may benefit from the use of lyric hearing aids, which can be left in his ear canals for months.  This will decrease the chance of losing his hearing aids in the future. 4.  The patient will return for reevaluation in 1 year, sooner if needed.

## 2023-07-07 DIAGNOSIS — H6123 Impacted cerumen, bilateral: Secondary | ICD-10-CM | POA: Insufficient documentation

## 2023-07-07 DIAGNOSIS — H903 Sensorineural hearing loss, bilateral: Secondary | ICD-10-CM | POA: Insufficient documentation

## 2023-08-11 DIAGNOSIS — Z79899 Other long term (current) drug therapy: Secondary | ICD-10-CM | POA: Diagnosis not present

## 2023-10-08 DIAGNOSIS — E1165 Type 2 diabetes mellitus with hyperglycemia: Secondary | ICD-10-CM | POA: Diagnosis not present

## 2023-10-08 DIAGNOSIS — E1122 Type 2 diabetes mellitus with diabetic chronic kidney disease: Secondary | ICD-10-CM | POA: Diagnosis not present

## 2023-10-08 DIAGNOSIS — E1129 Type 2 diabetes mellitus with other diabetic kidney complication: Secondary | ICD-10-CM | POA: Diagnosis not present

## 2023-10-08 DIAGNOSIS — Z7901 Long term (current) use of anticoagulants: Secondary | ICD-10-CM | POA: Diagnosis not present

## 2023-10-08 DIAGNOSIS — Z6835 Body mass index (BMI) 35.0-35.9, adult: Secondary | ICD-10-CM | POA: Diagnosis not present

## 2023-10-08 DIAGNOSIS — F411 Generalized anxiety disorder: Secondary | ICD-10-CM | POA: Diagnosis not present

## 2023-10-08 DIAGNOSIS — E1140 Type 2 diabetes mellitus with diabetic neuropathy, unspecified: Secondary | ICD-10-CM | POA: Diagnosis not present

## 2023-10-08 DIAGNOSIS — I4891 Unspecified atrial fibrillation: Secondary | ICD-10-CM | POA: Diagnosis not present

## 2023-10-08 DIAGNOSIS — I129 Hypertensive chronic kidney disease with stage 1 through stage 4 chronic kidney disease, or unspecified chronic kidney disease: Secondary | ICD-10-CM | POA: Diagnosis not present

## 2023-12-10 ENCOUNTER — Other Ambulatory Visit: Payer: Self-pay | Admitting: Cardiology

## 2024-02-15 ENCOUNTER — Other Ambulatory Visit: Payer: Self-pay | Admitting: Cardiovascular Disease

## 2024-02-22 ENCOUNTER — Ambulatory Visit: Admitting: Student

## 2024-02-22 NOTE — Progress Notes (Deleted)
 Cardiology Office Note    Date:  02/22/2024  ID:  [name], DOB [birth year], MRN [id number] Cardiologist: Maude Emmer, MD { :  History of Present Illness:    [name] is a 78 y.o. 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  He was last examined by [name], NP in 10/2022 and was struggling with worsening dementia at that time.  He did not any specific anginal symptoms but activity was limited given arthritis and he was using a cane for ambulation.  No changes were made to his cardiac medications and he was continued on atorvastatin 20 mg daily, Cardizem  CD 180 mg daily, lisinopril  20 mg twice daily, Toprol -XL 50 mg daily and Coumadin .  It appears he no-showed for his Coumadin  checks in 01/2023. INR was checked by Dr. Bertell by review of [facility] DXA in 07/2023 but no recent values.   - INR??!  ROS: ***  Studies Reviewed:   EKG: EKG is*** ordered today and demonstrates ***   EKG Interpretation Date/Time:    Ventricular Rate:    PR Interval:    QRS Duration:    QT Interval:    QTC Calculation:   R Axis:      Text Interpretation:         Echocardiogram: 08/2021 IMPRESSIONS     1. Left ventricular ejection fraction, by estimation, is 70 to 75%. The  left ventricle has hyperdynamic function. The left ventricle has no  regional wall motion abnormalities. There is mild left ventricular  hypertrophy. Left ventricular diastolic  parameters are indeterminate.   2. Right ventricular systolic function is normal. The right ventricular  size is mildly enlarged.   3. Left atrial size was moderately dilated.   4. The mitral valve is normal in structure. Trivial mitral valve  regurgitation.   5. The aortic valve is tricuspid. Aortic valve regurgitation is not  visualized.  Aortic valve sclerosis is present, with no evidence of aortic  valve stenosis.   6. The inferior vena cava is normal in size with greater than 50%  respiratory variability, suggesting right atrial pressure of 3 mmHg.   Comparison(s): The left ventricular function is unchanged.   Risk Assessment/Calculations:   {Does this patient have ATRIAL FIBRILLATION?[phone]} No BP recorded.  {Refresh Note OR Click here to enter BP  :1}***         Physical Exam:   VS:  There were no vitals taken for this visit.   Wt Readings from Last 3 Encounters:  07/06/23 220 lb (99.8 kg)  11/17/22 219 lb 12.8 oz (99.7 kg)  09/29/22 219 lb (99.3 kg)     GEN: Well nourished, well developed in no acute distress NECK: No JVD; No carotid bruits CARDIAC: ***RRR, no murmurs, rubs, gallops RESPIRATORY:  Clear to auscultation without rales, wheezing or rhonchi  ABDOMEN: Appears non-distended. No obvious abdominal masses. EXTREMITIES: No clubbing or cyanosis. No edema.  Distal pedal pulses are 2+ bilaterally.   Assessment and Plan:   1. Permanent atrial fibrillation (HCC) - Rates are in the *** during today's visit.  - He is currently on Coumadin  for anticoagulation ***.   2. History of cardiomyopathy - His EF was previously reduced at 40 to 45% in 2015 which is in the setting of poorly rate controlled atrial fibrillation and had  normalized in the interim.  EF was at 70 to 75% by most recent imaging in 08/2021.  3. Essential hypertension - BP is at **** during today's visit.  Continue current medical therapy with lisinopril  20 mg twice daily, Cardizem  CD 180 mg daily and Toprol -XL 50 mg daily.  4. Mixed hyperlipidemia - LDL was previously at 67 when checked in 02/2023.  5. Stage 3 CKD - BMET in 05/2023 by review of [facility] DXA showed his creatinine was at 1.74 and K+ was significantly elevated at 6.2.  Signed, Laymon CHRISTELLA Qua, PA-C

## 2024-03-01 ENCOUNTER — Other Ambulatory Visit: Payer: Self-pay | Admitting: Cardiovascular Disease

## 2024-03-07 NOTE — Telephone Encounter (Signed)
 Pt of Dr. Delford. This RX was prescribed 10 years ago in the hospital. Never refilled. This RR+X was mentioned as Pt is still taking at last O/V with an APP. Does Dr. Delford want to refill this RX? Please advise.

## 2024-04-20 ENCOUNTER — Other Ambulatory Visit: Payer: Self-pay | Admitting: Cardiovascular Disease

## 2024-04-27 NOTE — Progress Notes (Unsigned)
 "  Cardiology Office Note    Date:  04/27/2024  ID:  [name], DOB 19-May-1945, MRN [id number] Cardiologist: Maude Emmer, MD { :  History of Present Illness:    [name] is a 79 y.o. 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   He was last examined by [name], NP in 10/2022 and was struggling with worsening dementia at that time. He did not any specific anginal symptoms but activity was limited given arthritis and he was using a cane for ambulation.  No changes were made to his cardiac medications and he was continued on Atorvastatin 20 mg daily, Cardizem  CD 180 mg daily, Lisinopril  20 mg twice daily, Toprol -XL 50 mg daily and Coumadin .   It appears he no-showed for his Coumadin  checks in 01/2023. INR was checked by Dr. Bertell by review of [facility] DXA in 07/2023 but no recent values.    - INR??!  ROS: ***  Studies Reviewed:   EKG: EKG is*** ordered today and demonstrates ***   EKG Interpretation Date/Time:    Ventricular Rate:    PR Interval:    QRS Duration:    QT Interval:    QTC Calculation:   R Axis:      Text Interpretation:         Echocardiogram: 08/2021 IMPRESSIONS     1. Left ventricular ejection fraction, by estimation, is 70 to 75%. The  left ventricle has hyperdynamic function. The left ventricle has no  regional wall motion abnormalities. There is mild left ventricular  hypertrophy. Left ventricular diastolic  parameters are indeterminate.   2. Right ventricular systolic function is normal. The right ventricular  size is mildly enlarged.   3. Left atrial size was moderately dilated.   4. The mitral valve is normal in structure. Trivial mitral valve  regurgitation.   5. The aortic valve is tricuspid. Aortic valve regurgitation is not  visualized.  Aortic valve sclerosis is present, with no evidence of aortic  valve stenosis.   6. The inferior vena cava is normal in size with greater than 50%  respiratory variability, suggesting right atrial pressure of 3 mmHg.   Comparison(s): The left ventricular function is unchanged.   Risk Assessment/Calculations:   {Does this patient have ATRIAL FIBRILLATION?:[id number]} No BP recorded.  {Refresh Note OR Click here to enter BP  :1}***         Physical Exam:   VS:  There were no vitals taken for this visit.   Wt Readings from Last 3 Encounters:  07/06/23 220 lb (99.8 kg)  11/17/22 219 lb 12.8 oz (99.7 kg)  09/29/22 219 lb (99.3 kg)     GEN: Well nourished, well developed in no acute distress NECK: No JVD; No carotid bruits CARDIAC: ***RRR, no murmurs, rubs, gallops RESPIRATORY:  Clear to auscultation without rales, wheezing or rhonchi  ABDOMEN: Appears non-distended. No obvious abdominal masses. EXTREMITIES: No clubbing or cyanosis. No edema.  Distal pedal pulses are 2+ bilaterally.   Assessment and Plan:   1. Permanent atrial fibrillation (HCC) - Rates are in the *** during today's visit.  - He is currently on Coumadin  for anticoagulation ***.    2. History of cardiomyopathy - His EF was previously reduced at 40 to 45% in 2015 which is in the setting of poorly rate-controlled atrial  fibrillation and had normalized in the interim. EF was at 70 to 75% by most recent imaging in 08/2021.   3. Essential hypertension - BP is at **** during today's visit. Continue current medical therapy with Lisinopril  20 mg twice daily, Cardizem  CD 180 mg daily and Toprol -XL 50 mg daily.   4. Mixed hyperlipidemia - LDL was previously at 67 when checked in 02/2023.   5. Stage 3 CKD - BMET in 05/2023 by review of [facility] DXA showed his creatinine was at 1.74 and K+ was significantly elevated at 6.2.   Signed, Laymon CHRISTELLA Qua, PA-C   "

## 2024-04-28 ENCOUNTER — Ambulatory Visit: Admitting: Student

## 2024-05-09 ENCOUNTER — Ambulatory Visit: Admitting: Student
